# Patient Record
Sex: Female | Born: 1965
Health system: Southern US, Community
[De-identification: ages and names within clinical notes are randomized; demographics above are authoritative.]

## PROBLEM LIST (undated history)

## (undated) DIAGNOSIS — I213 ST elevation (STEMI) myocardial infarction of unspecified site: Secondary | ICD-10-CM

## (undated) DIAGNOSIS — I2510 Atherosclerotic heart disease of native coronary artery without angina pectoris: Secondary | ICD-10-CM

## (undated) DIAGNOSIS — I10 Essential (primary) hypertension: Secondary | ICD-10-CM

## (undated) DIAGNOSIS — Z9289 Personal history of other medical treatment: Secondary | ICD-10-CM

## (undated) DIAGNOSIS — I469 Cardiac arrest, cause unspecified: Secondary | ICD-10-CM

## (undated) DIAGNOSIS — E785 Hyperlipidemia, unspecified: Secondary | ICD-10-CM

## (undated) DIAGNOSIS — B009 Herpesviral infection, unspecified: Secondary | ICD-10-CM

## (undated) DIAGNOSIS — I4901 Ventricular fibrillation: Secondary | ICD-10-CM

## (undated) HISTORY — DX: Atherosclerotic heart disease of native coronary artery without angina pectoris: I25.10

## (undated) HISTORY — DX: ST elevation (STEMI) myocardial infarction of unspecified site: I21.3

## (undated) HISTORY — DX: Herpesviral infection, unspecified: B00.9

## (undated) HISTORY — DX: Ventricular fibrillation: I49.01

## (undated) HISTORY — DX: Personal history of other medical treatment: Z92.89

## (undated) HISTORY — DX: Essential (primary) hypertension: I10

## (undated) HISTORY — DX: Hyperlipidemia, unspecified: E78.5

---

## 1999-10-06 ENCOUNTER — Inpatient Hospital Stay (HOSPITAL_COMMUNITY): Admission: AD | Admit: 1999-10-06 | Discharge: 1999-10-06 | Payer: Self-pay | Admitting: Obstetrics and Gynecology

## 2004-02-12 ENCOUNTER — Encounter: Admission: RE | Admit: 2004-02-12 | Discharge: 2004-02-12 | Payer: Self-pay | Admitting: Cardiovascular Disease

## 2004-02-20 ENCOUNTER — Ambulatory Visit (HOSPITAL_COMMUNITY): Admission: RE | Admit: 2004-02-20 | Discharge: 2004-02-20 | Payer: Self-pay | Admitting: Cardiovascular Disease

## 2005-09-09 HISTORY — PX: ABDOMINAL HYSTERECTOMY: SHX81

## 2017-11-16 ENCOUNTER — Encounter: Payer: Self-pay | Admitting: *Deleted

## 2017-12-13 ENCOUNTER — Ambulatory Visit (HOSPITAL_COMMUNITY): Payer: Self-pay | Admitting: Internal Medicine

## 2017-12-13 ENCOUNTER — Encounter: Payer: Self-pay | Admitting: Adult Health

## 2017-12-18 ENCOUNTER — Encounter (HOSPITAL_COMMUNITY): Payer: Self-pay | Admitting: Hematology

## 2017-12-18 ENCOUNTER — Encounter: Payer: Self-pay | Admitting: Adult Health

## 2017-12-18 ENCOUNTER — Other Ambulatory Visit: Payer: Self-pay

## 2017-12-18 ENCOUNTER — Inpatient Hospital Stay (HOSPITAL_COMMUNITY): Payer: 59

## 2017-12-18 ENCOUNTER — Inpatient Hospital Stay (HOSPITAL_COMMUNITY): Payer: 59 | Attending: Hematology | Admitting: Hematology

## 2017-12-18 ENCOUNTER — Ambulatory Visit (INDEPENDENT_AMBULATORY_CARE_PROVIDER_SITE_OTHER): Payer: 59 | Admitting: Adult Health

## 2017-12-18 VITALS — BP 112/66 | HR 88 | Resp 16 | Ht 62.5 in | Wt 242.0 lb

## 2017-12-18 VITALS — BP 115/64 | HR 79 | Temp 98.9°F | Wt 242.6 lb

## 2017-12-18 DIAGNOSIS — R7989 Other specified abnormal findings of blood chemistry: Secondary | ICD-10-CM

## 2017-12-18 DIAGNOSIS — M25551 Pain in right hip: Secondary | ICD-10-CM

## 2017-12-18 DIAGNOSIS — Z0000 Encounter for general adult medical examination without abnormal findings: Secondary | ICD-10-CM | POA: Diagnosis not present

## 2017-12-18 DIAGNOSIS — N939 Abnormal uterine and vaginal bleeding, unspecified: Secondary | ICD-10-CM | POA: Diagnosis not present

## 2017-12-18 LAB — COMPREHENSIVE METABOLIC PANEL
ALT: 13 U/L — ABNORMAL LOW (ref 14–54)
ANION GAP: 8 (ref 5–15)
AST: 16 U/L (ref 15–41)
Albumin: 3.8 g/dL (ref 3.5–5.0)
Alkaline Phosphatase: 92 U/L (ref 38–126)
BILIRUBIN TOTAL: 0.5 mg/dL (ref 0.3–1.2)
BUN: 9 mg/dL (ref 6–20)
CO2: 25 mmol/L (ref 22–32)
Calcium: 8.7 mg/dL — ABNORMAL LOW (ref 8.9–10.3)
Chloride: 106 mmol/L (ref 101–111)
Creatinine, Ser: 0.87 mg/dL (ref 0.44–1.00)
Glucose, Bld: 107 mg/dL — ABNORMAL HIGH (ref 65–99)
POTASSIUM: 3.8 mmol/L (ref 3.5–5.1)
Sodium: 139 mmol/L (ref 135–145)
TOTAL PROTEIN: 7.1 g/dL (ref 6.5–8.1)

## 2017-12-18 LAB — SEDIMENTATION RATE: Sed Rate: 12 mm/hr (ref 0–22)

## 2017-12-18 LAB — IRON AND TIBC
Iron: 52 ug/dL (ref 28–170)
SATURATION RATIOS: 20 % (ref 10.4–31.8)
TIBC: 260 ug/dL (ref 250–450)
UIBC: 208 ug/dL

## 2017-12-18 LAB — C-REACTIVE PROTEIN: CRP: <0.8 mg/dL (ref <1.0)

## 2017-12-18 LAB — FERRITIN: FERRITIN: 227 ng/mL (ref 11–307)

## 2017-12-18 MED ORDER — METRONIDAZOLE 0.75 % VA GEL: 1.0000 | Freq: Every day | VAGINAL | 0 refills | Status: DC

## 2017-12-18 NOTE — Progress Notes (Signed)
Subjective:     Patient ID: Erin Cantu, female   DOB: 02-20-1966, 52 y.o.   MRN: [id number]  HPI [name] is a 52 year old black female, sp hysterectomy in 2006 for fibroids referred by Dr Sherryll BurgerShah for vaginal bleeding, she says the bleeding has been going on for about 5 years now.Dr Sherryll BurgerShah used silver nitrate to granulation tissue of vaginal cuff in 2016, but continued to spot, and just sees when she wipes.  She works at home for [employer]. PCP is Dr Sherryll BurgerShah.  Review of Systems Vaginal bleeding for about 5 years, sp hysterectomy No sex since December Right hip hurts when stands for long time, had injection for bursitis years ago.  Reviewed past medical,surgical, social and family history. Reviewed medications and allergies.     Objective:   Physical Exam BP 112/66 (BP Location: Left Arm, Patient Position: Sitting, Cuff Size: Large)   Pulse 88   Resp 16   Ht 5' 2.5" (1.588 m)   Wt 242 lb (109.8 kg)   LMP  (Exact Date)   BMI 43.56 kg/m   Skin warm and dry.Pelvic: external genitalia is normal in appearance no lesions, vagina: pink with good moisture, no discharge or bleeding noted,Vaginal cuff is tender when touched with procto swab, but no bleeding, urethra has no lesions or masses noted, cervix and uterus are absent, adnexa: no masses or tenderness noted. Bladder is non tender and no masses felt. PHQ 2 score 0.  Will prescribe  Metrogel for 5 nights and get GYN US to assess.     Assessment:     Vaginal bleeding after hysterectomy for fibroids in 2006 Vaginal cuff tenderness     Plan:     Rx Metrogel 1 applicator full at hs for 5 nights Return in 1 week for GYN US, we will talk when results in If has bleeding call and I will try to see that day

## 2017-12-18 NOTE — Progress Notes (Signed)
CONSULT NOTE  Patient Care Team: Medicine, [facility] as PCP - General (Internal Medicine)  CHIEF COMPLAINTS/PURPOSE OF CONSULTATION:  Elevated ferritin levels.  HISTORY OF PRESENTING ILLNESS:  Erin Cantu 52 y.o. female is here because of elevated ferritin levels noted on her blood work at Dr. Trena Platt office.  She reports that she has elevated ferritin for the last couple of years but did not follow-up on it.  She does not have any history of blood transfusions.  No personal history or family history of hemochromatosis.  Denies any liver problems.  Denies any personal or family history of thalassemia.  No connective tissue disorders noted.  She denies any fevers, night sweats or weight loss in the last 6 months.  She had about 30-40 pound weight gain in the last 2 years.  She also complains of vague right loin pain, made worse when she stands up for long time.  She is not on any prescription medications.  She takes MiraLAX only as needed for constipation.   MEDICAL HISTORY:  History reviewed. No pertinent past medical history.  SURGICAL HISTORY: Past Surgical History:  Procedure Laterality Date  . ABDOMINAL HYSTERECTOMY  09/2005   done for fibroids    SOCIAL HISTORY: Social History   Socioeconomic History  . Marital status: Divorced    Spouse name: Not on file  . Number of children: Not on file  . Years of education: Not on file  . Highest education level: Not on file  Social Needs  . Financial resource strain: Not on file  . Food insecurity - worry: Not on file  . Food insecurity - inability: Not on file  . Transportation needs - medical: Not on file  . Transportation needs - non-medical: Not on file  Occupational History  . Not on file  Tobacco Use  . Smoking status: Never Smoker  . Smokeless tobacco: Never Used  Substance and Sexual Activity  . Alcohol use: No    Frequency: Never  . Drug use: No  . Sexual activity: Not Currently    Birth control/protection:  Post-menopausal, Surgical    Comment: hysterectomy  Other Topics Concern  . Not on file  Social History Narrative  . Not on file    FAMILY HISTORY: Family History  Problem Relation Age of Onset  . Cancer Paternal Grandmother        breast  . Diabetes Maternal Grandmother   . COPD Father   . Stroke Mother   . Colon cancer Mother   . Cancer Brother        metastatic lung cancer  . Proteinuria Daughter     ALLERGIES:  has No Known Allergies.  MEDICATIONS:  No current outpatient medications on file.   No current facility-administered medications for this visit.     REVIEW OF SYSTEMS:   Constitutional: Denies fevers, chills or abnormal night sweats Eyes: Denies blurriness of vision, double vision or watery eyes Ears, nose, mouth, throat, and face: Denies mucositis or sore throat Respiratory: Denies cough, dyspnea or wheezes Cardiovascular: Denies palpitation, chest discomfort or lower extremity swelling Gastrointestinal:  Denies nausea, heartburn or change in bowel habits Skin: Denies abnormal skin rashes Lymphatics: Denies new lymphadenopathy or easy bruising Neurological:Denies numbness, tingling or new weaknesses Behavioral/Psych: Mood is stable, no new changes  All other systems were reviewed with the patient and are negative.  PHYSICAL EXAMINATION: ECOG PERFORMANCE STATUS: 0 - Asymptomatic  Vitals:   12/18/17 0909  BP: 115/64  Pulse: 79  Temp: 98.9  F (37.2 C)  SpO2: 100%   Filed Weights   12/18/17 0909  Weight: 242 lb 9.6 oz (110 kg)    GENERAL:alert, no distress and comfortable SKIN: skin color, texture, turgor are normal, no rashes or significant lesions EYES: normal, conjunctiva are pink and non-injected, sclera clear OROPHARYNX:no exudate, no erythema and lips, buccal mucosa, and tongue normal  NECK: supple, thyroid normal size, non-tender, without nodularity LYMPH:  no palpable lymphadenopathy in the cervical, axillary or inguinal LUNGS: clear to  auscultation and percussion with normal breathing effort HEART: regular rate & rhythm and no murmurs and no lower extremity edema ABDOMEN:abdomen soft, non-tender and normal bowel sounds Musculoskeletal:no cyanosis of digits and no clubbing  PSYCH: alert & oriented x 3 with fluent speech NEURO: no focal motor/sensory deficits  LABORATORY DATA:  I have reviewed the data as listed No results found for this or any previous visit (from the past 2160 hour(s)).  RADIOGRAPHIC STUDIES: I have personally reviewed the radiological images as listed and agreed with the findings in the report. No results found.  ASSESSMENT & PLAN:  1. Elevated ferritin levels: Her ferritin was elevated at 464.  However her saturation was 28% which was within normal limits.  She had a normal hemoglobin with a normal MCV.  Differential diagnosis of iron overload includes inflammatory conditions, liver disease, heterozygous hemochromatosis.  We will repeat ferritin, iron panel, and LFTs today.  I would also send a hepatitis panel,ESR, And CRP .  We will also send hemochromatosis panel.  She will be seen back in 3-4 weeks for follow-up.  2. Healthcare maintenance: She did not have mammograms in the last 2 years.  I have ordered her screening mammogram today.    All questions were answered. The patient knows to call the clinic with any problems, questions or concerns.      Derek Jack, MD 12/18/17 10:47 AM

## 2017-12-19 LAB — HEPATITIS PANEL, ACUTE
HCV Ab: <0.1 s/co ratio (ref 0.0–0.9)
HEP A IGM: NEGATIVE
HEP B C IGM: NEGATIVE
Hepatitis B Surface Ag: NEGATIVE

## 2017-12-20 ENCOUNTER — Ambulatory Visit (HOSPITAL_COMMUNITY): Payer: 59

## 2017-12-22 LAB — HEMOCHROMATOSIS DNA-PCR(C282Y,H63D)

## 2017-12-28 ENCOUNTER — Other Ambulatory Visit: Payer: Self-pay

## 2018-01-18 ENCOUNTER — Ambulatory Visit (HOSPITAL_COMMUNITY): Payer: 59 | Admitting: Hematology

## 2018-01-23 ENCOUNTER — Telehealth: Payer: Self-pay | Admitting: Adult Health

## 2018-01-23 MED ORDER — NITROFURANTOIN MONOHYD MACRO 100 MG PO CAPS: 100.0000 mg | ORAL_CAPSULE | Freq: Two times a day (BID) | ORAL | 0 refills | Status: DC

## 2018-01-23 NOTE — Telephone Encounter (Signed)
Pt using WIFI number and it keeps cutting off, has burning when pees, so called back and left message that she needs appt.

## 2018-01-23 NOTE — Telephone Encounter (Signed)
Pt called back, can't come in for appt, will rx macrobid, and try to get US rescheduled.

## 2018-01-23 NOTE — Telephone Encounter (Signed)
Patient called stating that she has a bad UTI and she would like for Victorino DikeJennifer to call her in something to [facility] [place]. Please contact pt

## 2018-02-01 ENCOUNTER — Other Ambulatory Visit: Payer: Self-pay | Admitting: Family Medicine

## 2018-02-01 ENCOUNTER — Other Ambulatory Visit (HOSPITAL_COMMUNITY)
Admission: RE | Admit: 2018-02-01 | Discharge: 2018-02-01 | Disposition: A | Discharge status: Home / Self Care | Payer: 59 | Source: Ambulatory Visit | Attending: Family Medicine | Admitting: Family Medicine

## 2018-02-01 DIAGNOSIS — Z01419 Encounter for gynecological examination (general) (routine) without abnormal findings: Secondary | ICD-10-CM | POA: Diagnosis not present

## 2018-02-05 LAB — CYTOLOGY - PAP: Diagnosis: NEGATIVE

## 2018-02-26 ENCOUNTER — Ambulatory Visit (HOSPITAL_COMMUNITY): Payer: 59 | Admitting: Hematology

## 2018-07-04 ENCOUNTER — Emergency Department (HOSPITAL_COMMUNITY): Payer: 59

## 2018-07-04 ENCOUNTER — Other Ambulatory Visit: Payer: Self-pay

## 2018-07-04 ENCOUNTER — Emergency Department (HOSPITAL_COMMUNITY)
Admission: EM | Admit: 2018-07-04 | Discharge: 2018-07-04 | Disposition: A | Discharge status: Home / Self Care | Payer: 59 | Attending: Emergency Medicine | Admitting: Emergency Medicine

## 2018-07-04 ENCOUNTER — Encounter (HOSPITAL_COMMUNITY): Payer: Self-pay

## 2018-07-04 DIAGNOSIS — R0789 Other chest pain: Secondary | ICD-10-CM | POA: Diagnosis not present

## 2018-07-04 DIAGNOSIS — Z79899 Other long term (current) drug therapy: Secondary | ICD-10-CM | POA: Insufficient documentation

## 2018-07-04 DIAGNOSIS — R079 Chest pain, unspecified: Secondary | ICD-10-CM | POA: Diagnosis present

## 2018-07-04 LAB — CBC
HCT: 39.3 % (ref 36.0–46.0)
Hemoglobin: 12.5 g/dL (ref 12.0–15.0)
MCH: 28.9 pg (ref 26.0–34.0)
MCHC: 31.8 g/dL (ref 30.0–36.0)
MCV: 91 fL (ref 78.0–100.0)
PLATELETS: 200 10*3/uL (ref 150–400)
RBC: 4.32 MIL/uL (ref 3.87–5.11)
RDW: 13 % (ref 11.5–15.5)
WBC: 5 10*3/uL (ref 4.0–10.5)

## 2018-07-04 LAB — BASIC METABOLIC PANEL
ANION GAP: 8 (ref 5–15)
BUN: 9 mg/dL (ref 6–20)
CALCIUM: 8.9 mg/dL (ref 8.9–10.3)
CHLORIDE: 106 mmol/L (ref 98–111)
CO2: 26 mmol/L (ref 22–32)
CREATININE: 0.82 mg/dL (ref 0.44–1.00)
GFR calc Af Amer: >60 mL/min (ref >60)
GFR calc non Af Amer: >60 mL/min (ref >60)
GLUCOSE: 99 mg/dL (ref 70–99)
Potassium: 3.3 mmol/L — ABNORMAL LOW (ref 3.5–5.1)
Sodium: 140 mmol/L (ref 135–145)

## 2018-07-04 LAB — TROPONIN I
Troponin I: <0.03 ng/mL (ref <0.03)
Troponin I: <0.03 ng/mL (ref <0.03)

## 2018-07-04 LAB — D-DIMER, QUANTITATIVE: D-Dimer, Quant: 0.44 ug/mL-FEU (ref 0.00–0.50)

## 2018-07-04 MED ORDER — KETOROLAC TROMETHAMINE 30 MG/ML IJ SOLN
30.0000 mg | Freq: Once | INTRAMUSCULAR | Status: AC
Start: 2018-07-04 — End: 2018-07-04
  Administered 2018-07-04: 30 mg via INTRAVENOUS
  Filled 2018-07-04: qty 1

## 2018-07-04 MED ORDER — TRAMADOL HCL 50 MG PO TABS: 50.0000 mg | ORAL_TABLET | Freq: Four times a day (QID) | ORAL | 0 refills | Status: DC | PRN

## 2018-07-04 NOTE — Discharge Instructions (Signed)
Chest pain most likely chest wall in nature.  Take the tramadol as directed.  Make an appointment follow-up with your regular doctor.  Return for any new or worse symptoms.  Today's work-up to include cardiac marker troponin x2 were negative.  Also d-dimer screening test for blood clots in the lungs was also negative.  Chest x-ray without any acute findings.  EKG without any acute abnormalities.

## 2018-07-04 NOTE — ED Triage Notes (Signed)
Pt says has been under a lot of stress recently and was crying yesterday and upset and started having chest pain.  Pt says pain is worse when she bends over.  Denies sob, n/v.  Pt says pain is nonradiating and is in the center of her chest.

## 2018-07-04 NOTE — ED Provider Notes (Signed)
[facility] Provider Note   CSN: [id number] Arrival date & time: 07/04/18  1413     History   Chief Complaint Chief Complaint  Patient presents with  . Chest Pain    HPI Erin Cantu is a 52 y.o. female.  Patient admits to being under a lot of stress.  Yesterday patient had substernal chest pain from 3 PM to 4 PM then went away.  Today at about 11 AM in the morning it started again and has been persistent nonstop it has waxed and waned some but never gone away.  Not associated with shortness of breath not associated with nausea or vomiting.  Made worse by leaning over and by certain movements.  Patient has no lower extremity swelling no known history of coronary artery disease.  No known risk factors.  No family history of premature heart disease.  Patient is never smoked.  Past medical history noncontributory past surgical history abdominal hysterectomy.  Pain is been nonradiating.     History reviewed. No pertinent past medical history.  There are no active problems to display for this patient.   Past Surgical History:  Procedure Laterality Date  . ABDOMINAL HYSTERECTOMY  09/2005   done for fibroids     OB History    Gravida  2   Para  1   Term  1   Preterm      AB  1   Living  1     SAB  1   TAB      Ectopic      Multiple      Live Births               Home Medications    Prior to Admission medications   Medication Sig Start Date End Date Taking? Authorizing Provider  Cholecalciferol (VITAMIN D3) 5000 units TABS Take 5,000 tablets by mouth daily.   Yes [provider]  ibuprofen (ADVIL,MOTRIN) 200 MG tablet Take 400 mg by mouth every 6 (six) hours as needed for moderate pain.   Yes [provider]  traMADol (ULTRAM) 50 MG tablet Take 1 tablet (50 mg total) by mouth every 6 (six) hours as needed. 07/04/18   Vanetta MuldersZackowski, Natalee Tomkiewicz, MD    Family History Family History  Problem Relation Age of Onset  . Cancer  Paternal Grandmother        breast  . Diabetes Maternal Grandmother   . COPD Father   . Stroke Mother   . Colon cancer Mother   . Cancer Brother        metastatic lung cancer  . Proteinuria Daughter     Social History Social History   Tobacco Use  . Smoking status: Never Smoker  . Smokeless tobacco: Never Used  Substance Use Topics  . Alcohol use: No    Frequency: Never  . Drug use: No     Allergies   Patient has no known allergies.   Review of Systems Review of Systems  Constitutional: Negative for diaphoresis and fever.  HENT: Negative for congestion.   Eyes: Negative for visual disturbance.  Respiratory: Negative for shortness of breath.   Cardiovascular: Positive for chest pain. Negative for leg swelling.  Gastrointestinal: Negative for abdominal pain, nausea and vomiting.  Genitourinary: Negative for dysuria.  Musculoskeletal: Negative for back pain.  Skin: Negative for rash.  Neurological: Negative for syncope.  Hematological: Does not bruise/bleed easily.  Psychiatric/Behavioral: Negative for confusion.     Physical Exam Updated Vital  Signs BP 136/78   Pulse (!) 56   Temp 98.5 F (36.9 C) (Oral)   Resp 20   Ht 1.575 m (5\' 2" )   Wt 102.1 kg   SpO2 99%   BMI 41.15 kg/m   Physical Exam  Constitutional: She is oriented to person, place, and time. She appears well-developed and well-nourished. No distress.  HENT:  Head: Normocephalic and atraumatic.  Mouth/Throat: Oropharynx is clear and moist.  Eyes: Pupils are equal, round, and reactive to light. Conjunctivae and EOM are normal.  Neck: Neck supple.  Cardiovascular: Normal rate and regular rhythm.  Pulmonary/Chest: Effort normal. No respiratory distress. She exhibits tenderness.  Abdominal: Soft. Bowel sounds are normal. There is no tenderness.  Musculoskeletal: She exhibits no edema.  Neurological: She is alert and oriented to person, place, and time. No cranial nerve deficit or sensory  deficit. She exhibits normal muscle tone. Coordination normal.  Skin: Skin is warm. No rash noted.  Nursing note and vitals reviewed.    ED Treatments / Results  Labs (all labs ordered are listed, but only abnormal results are displayed) Labs Reviewed  BASIC METABOLIC PANEL - Abnormal; Notable for the following components:      Result Value   Potassium 3.3 (*)    All other components within normal limits  CBC  TROPONIN I  TROPONIN I  D-DIMER, QUANTITATIVE (NOT AT [facility])    EKG EKG Interpretation  Date/Time:  Wednesday July 04 2018 14:20:40 EDT Ventricular Rate:  75 PR Interval:  174 QRS Duration: 76 QT Interval:  370 QTC Calculation: 413 R Axis:   69 Text Interpretation:  Normal sinus rhythm Low voltage QRS Borderline ECG No previous ECGs available Confirmed by Vanetta Mulders [phone]) on 07/04/2018 4:51:06 PM   Radiology Dg Chest 2 View  Result Date: 07/04/2018 CLINICAL DATA:  Acute onset of mid chest pain which the patient states may be anxiety induced. The chest pain began yesterday. EXAM: CHEST - 2 VIEW COMPARISON:  None. FINDINGS: Cardiomediastinal silhouette unremarkable. Small nodule overlying the RIGHT lung base on the PA image, not localized on the LATERAL image, dense for its small size. Lungs otherwise clear. Bronchovascular markings normal. Pulmonary vascularity normal. No pleural effusions. Minimal degenerative changes involving the thoracic spine and slight thoracic dextroscoliosis. IMPRESSION: 1. Small nodule overlying the RIGHT lung base on the PA image, not localized on the LATERAL image. Given its density for its small size, this likely represents a benign calcified granuloma. A non-contrast CT chest may be confirmatory. 2.  No acute cardiopulmonary disease. Electronically Signed   By: Erin Saas M.D.   On: 07/04/2018 14:50    Procedures Procedures (including critical care time)  Medications Ordered in ED Medications  ketorolac (TORADOL) 30 MG/ML  injection 30 mg (30 mg Intravenous Given 07/04/18 1737)     Initial Impression / Assessment and Plan / ED Course  I have reviewed the triage vital signs and the nursing notes.  Pertinent labs & imaging results that were available during my care of the patient were reviewed by me and considered in my medical decision making (see chart for details).    Work-up here in the emergency department chest x-ray negative EKG without acute findings.  Troponins x2- d-dimer negative.  Patient with reproducible chest pain with palpation of the sternal area.  That is where the pain has been.  Helped with Toradol here.  Patient has primary care doctor to follow-up with his followed by [facility] physicians.  Patient will return for  any new or worse symptoms.  Clinically feel as this is chest wall pain and not an acute cardiac event.   Final Clinical Impressions(s) / ED Diagnoses   Final diagnoses:  Chest wall pain    ED Discharge Orders         Ordered    traMADol (ULTRAM) 50 MG tablet  Every 6 hours PRN     07/04/18 1900           Vanetta Mulders, MD 07/04/18 1908

## 2018-07-10 DIAGNOSIS — I213 ST elevation (STEMI) myocardial infarction of unspecified site: Secondary | ICD-10-CM

## 2018-07-10 HISTORY — DX: ST elevation (STEMI) myocardial infarction of unspecified site: I21.3

## 2018-07-11 ENCOUNTER — Other Ambulatory Visit: Payer: Self-pay

## 2018-07-11 ENCOUNTER — Emergency Department (HOSPITAL_COMMUNITY): Payer: 59

## 2018-07-11 ENCOUNTER — Encounter (HOSPITAL_COMMUNITY)
Admission: EM | Disposition: A | Discharge status: Home / Self Care | Payer: Self-pay | Source: Home / Self Care | Attending: Cardiovascular Disease

## 2018-07-11 ENCOUNTER — Inpatient Hospital Stay (HOSPITAL_COMMUNITY)
Admission: EM | Admit: 2018-07-11 | Discharge: 2018-07-13 | DRG: 250 | Disposition: A | Discharge status: Home / Self Care | Payer: 59 | Attending: Interventional Cardiology | Admitting: Interventional Cardiology

## 2018-07-11 DIAGNOSIS — I462 Cardiac arrest due to underlying cardiac condition: Secondary | ICD-10-CM | POA: Diagnosis present

## 2018-07-11 DIAGNOSIS — I2121 ST elevation (STEMI) myocardial infarction involving left circumflex coronary artery: Secondary | ICD-10-CM | POA: Diagnosis not present

## 2018-07-11 DIAGNOSIS — Z800 Family history of malignant neoplasm of digestive organs: Secondary | ICD-10-CM

## 2018-07-11 DIAGNOSIS — Z79899 Other long term (current) drug therapy: Secondary | ICD-10-CM | POA: Diagnosis not present

## 2018-07-11 DIAGNOSIS — I2119 ST elevation (STEMI) myocardial infarction involving other coronary artery of inferior wall: Secondary | ICD-10-CM | POA: Diagnosis present

## 2018-07-11 DIAGNOSIS — Z9861 Coronary angioplasty status: Secondary | ICD-10-CM

## 2018-07-11 DIAGNOSIS — Z825 Family history of asthma and other chronic lower respiratory diseases: Secondary | ICD-10-CM

## 2018-07-11 DIAGNOSIS — A599 Trichomoniasis, unspecified: Secondary | ICD-10-CM | POA: Diagnosis present

## 2018-07-11 DIAGNOSIS — Z823 Family history of stroke: Secondary | ICD-10-CM | POA: Diagnosis not present

## 2018-07-11 DIAGNOSIS — I10 Essential (primary) hypertension: Secondary | ICD-10-CM

## 2018-07-11 DIAGNOSIS — Z79891 Long term (current) use of opiate analgesic: Secondary | ICD-10-CM

## 2018-07-11 DIAGNOSIS — I2510 Atherosclerotic heart disease of native coronary artery without angina pectoris: Secondary | ICD-10-CM | POA: Diagnosis not present

## 2018-07-11 DIAGNOSIS — Z833 Family history of diabetes mellitus: Secondary | ICD-10-CM

## 2018-07-11 DIAGNOSIS — I4901 Ventricular fibrillation: Secondary | ICD-10-CM | POA: Diagnosis present

## 2018-07-11 DIAGNOSIS — Z791 Long term (current) use of non-steroidal anti-inflammatories (NSAID): Secondary | ICD-10-CM

## 2018-07-11 DIAGNOSIS — Z90710 Acquired absence of both cervix and uterus: Secondary | ICD-10-CM

## 2018-07-11 DIAGNOSIS — I469 Cardiac arrest, cause unspecified: Secondary | ICD-10-CM

## 2018-07-11 DIAGNOSIS — I493 Ventricular premature depolarization: Secondary | ICD-10-CM | POA: Diagnosis present

## 2018-07-11 DIAGNOSIS — E785 Hyperlipidemia, unspecified: Secondary | ICD-10-CM | POA: Diagnosis present

## 2018-07-11 DIAGNOSIS — E782 Mixed hyperlipidemia: Secondary | ICD-10-CM | POA: Diagnosis not present

## 2018-07-11 DIAGNOSIS — I213 ST elevation (STEMI) myocardial infarction of unspecified site: Secondary | ICD-10-CM

## 2018-07-11 DIAGNOSIS — I252 Old myocardial infarction: Secondary | ICD-10-CM | POA: Diagnosis present

## 2018-07-11 HISTORY — PX: LEFT HEART CATH AND CORONARY ANGIOGRAPHY: CATH118249

## 2018-07-11 HISTORY — PX: CORONARY BALLOON ANGIOPLASTY: CATH118233

## 2018-07-11 HISTORY — DX: Cardiac arrest, cause unspecified: I46.9

## 2018-07-11 LAB — CBC WITH DIFFERENTIAL/PLATELET
BASOS PCT: 1 %
Basophils Absolute: 0 10*3/uL (ref 0.0–0.1)
EOS ABS: 0.1 10*3/uL (ref 0.0–0.7)
EOS PCT: 3 %
HCT: 42.1 % (ref 36.0–46.0)
Hemoglobin: 13.5 g/dL (ref 12.0–15.0)
LYMPHS ABS: 3.2 10*3/uL (ref 0.7–4.0)
Lymphocytes Relative: 68 %
MCH: 29.2 pg (ref 26.0–34.0)
MCHC: 32.1 g/dL (ref 30.0–36.0)
MCV: 91.1 fL (ref 78.0–100.0)
Monocytes Absolute: 0.2 10*3/uL (ref 0.1–1.0)
Monocytes Relative: 5 %
Neutro Abs: 1.2 10*3/uL — ABNORMAL LOW (ref 1.7–7.7)
Neutrophils Relative %: 25 %
PLATELETS: 231 10*3/uL (ref 150–400)
RBC: 4.62 MIL/uL (ref 3.87–5.11)
RDW: 13.3 % (ref 11.5–15.5)
WBC: 4.8 10*3/uL (ref 4.0–10.5)

## 2018-07-11 LAB — COMPREHENSIVE METABOLIC PANEL
ALK PHOS: 88 U/L (ref 38–126)
ALT: 104 U/L — ABNORMAL HIGH (ref 0–44)
ANION GAP: 11 (ref 5–15)
AST: 106 U/L — ABNORMAL HIGH (ref 15–41)
Albumin: 3.8 g/dL (ref 3.5–5.0)
BILIRUBIN TOTAL: 0.5 mg/dL (ref 0.3–1.2)
BUN: 9 mg/dL (ref 6–20)
CALCIUM: 8.6 mg/dL — AB (ref 8.9–10.3)
CO2: 21 mmol/L — ABNORMAL LOW (ref 22–32)
Chloride: 105 mmol/L (ref 98–111)
Creatinine, Ser: 0.96 mg/dL (ref 0.44–1.00)
GFR calc non Af Amer: >60 mL/min (ref >60)
Glucose, Bld: 183 mg/dL — ABNORMAL HIGH (ref 70–99)
Potassium: 3.2 mmol/L — ABNORMAL LOW (ref 3.5–5.1)
Sodium: 137 mmol/L (ref 135–145)
TOTAL PROTEIN: 7 g/dL (ref 6.5–8.1)

## 2018-07-11 LAB — CBC
HCT: 42.4 % (ref 36.0–46.0)
HEMOGLOBIN: 13.3 g/dL (ref 12.0–15.0)
MCH: 28.9 pg (ref 26.0–34.0)
MCHC: 31.4 g/dL (ref 30.0–36.0)
MCV: 92 fL (ref 78.0–100.0)
PLATELETS: 256 10*3/uL (ref 150–400)
RBC: 4.61 MIL/uL (ref 3.87–5.11)
RDW: 13.2 % (ref 11.5–15.5)
WBC: 11.9 10*3/uL — ABNORMAL HIGH (ref 4.0–10.5)

## 2018-07-11 LAB — PROTIME-INR
INR: 1.01
PROTHROMBIN TIME: 13.2 s (ref 11.4–15.2)

## 2018-07-11 LAB — TROPONIN I: Troponin I: 13.39 ng/mL (ref <0.03)

## 2018-07-11 LAB — CREATININE, SERUM
CREATININE: 0.98 mg/dL (ref 0.44–1.00)
GFR calc Af Amer: >60 mL/min (ref >60)

## 2018-07-11 LAB — LIPID PANEL
CHOLESTEROL: 206 mg/dL — AB (ref 0–200)
HDL: 60 mg/dL (ref >40)
LDL Cholesterol: 132 mg/dL — ABNORMAL HIGH (ref 0–99)
TRIGLYCERIDES: 71 mg/dL (ref <150)
Total CHOL/HDL Ratio: 3.4 RATIO
VLDL: 14 mg/dL (ref 0–40)

## 2018-07-11 LAB — I-STAT BETA HCG BLOOD, ED (MC, WL, AP ONLY): I-stat hCG, quantitative: <5 m[IU]/mL (ref <5)

## 2018-07-11 LAB — APTT: aPTT: 21 seconds — ABNORMAL LOW (ref 24–36)

## 2018-07-11 LAB — I-STAT TROPONIN, ED: TROPONIN I, POC: 0.03 ng/mL (ref 0.00–0.08)

## 2018-07-11 LAB — MRSA PCR SCREENING: MRSA BY PCR: NEGATIVE

## 2018-07-11 LAB — POCT ACTIVATED CLOTTING TIME: ACTIVATED CLOTTING TIME: 230 s

## 2018-07-11 SURGERY — LEFT HEART CATH AND CORONARY ANGIOGRAPHY
Anesthesia: LOCAL

## 2018-07-11 MED ORDER — HEPARIN SODIUM (PORCINE) 5000 UNIT/ML IJ SOLN
5000.0000 [IU] | Freq: Once | INTRAMUSCULAR | Status: AC
  Administered 2018-07-11: 5000 [IU] via INTRAVENOUS

## 2018-07-11 MED ORDER — MIDAZOLAM HCL 2 MG/2ML IJ SOLN
INTRAMUSCULAR | Status: DC | PRN
  Administered 2018-07-11 (×3): 1 mg via INTRAVENOUS

## 2018-07-11 MED ORDER — ASPIRIN 81 MG PO CHEW
CHEWABLE_TABLET | ORAL | Status: AC
  Filled 2018-07-11: qty 1

## 2018-07-11 MED ORDER — MORPHINE SULFATE (PF) 2 MG/ML IV SOLN
2.0000 mg | Freq: Once | INTRAVENOUS | Status: AC
  Administered 2018-07-11: 2 mg via INTRAVENOUS

## 2018-07-11 MED ORDER — HEPARIN (PORCINE) IN NACL 100-0.45 UNIT/ML-% IJ SOLN
INTRAMUSCULAR | Status: AC
  Filled 2018-07-11: qty 250

## 2018-07-11 MED ORDER — ONDANSETRON HCL 4 MG/2ML IJ SOLN
INTRAMUSCULAR | Status: DC | PRN
  Administered 2018-07-11: 4 mg via INTRAVENOUS

## 2018-07-11 MED ORDER — MORPHINE SULFATE (PF) 2 MG/ML IV SOLN
INTRAVENOUS | Status: AC
  Filled 2018-07-11: qty 1

## 2018-07-11 MED ORDER — ASPIRIN 81 MG PO CHEW
324.0000 mg | CHEWABLE_TABLET | Freq: Once | ORAL | Status: AC
  Administered 2018-07-11: 324 mg via ORAL

## 2018-07-11 MED ORDER — HEPARIN (PORCINE) IN NACL 1000-0.9 UT/500ML-% IV SOLN
INTRAVENOUS | Status: DC | PRN
  Administered 2018-07-11 (×2): 500 mL

## 2018-07-11 MED ORDER — ASPIRIN 300 MG RE SUPP
RECTAL | Status: AC
  Filled 2018-07-11: qty 1

## 2018-07-11 MED ORDER — SODIUM CHLORIDE 0.9 % IV SOLN: INTRAVENOUS | Status: DC

## 2018-07-11 MED ORDER — METOPROLOL TARTRATE 12.5 MG HALF TABLET
12.5000 mg | ORAL_TABLET | Freq: Two times a day (BID) | ORAL | Status: DC
  Administered 2018-07-11 – 2018-07-13 (×4): 12.5 mg via ORAL
  Filled 2018-07-11 (×4): qty 1

## 2018-07-11 MED ORDER — LORAZEPAM 2 MG/ML IJ SOLN
INTRAMUSCULAR | Status: AC
  Filled 2018-07-11: qty 1

## 2018-07-11 MED ORDER — ONDANSETRON HCL 4 MG/2ML IJ SOLN
4.0000 mg | Freq: Four times a day (QID) | INTRAMUSCULAR | Status: DC | PRN
  Administered 2018-07-11: 4 mg via INTRAVENOUS
  Filled 2018-07-11: qty 2

## 2018-07-11 MED ORDER — HEPARIN (PORCINE) IN NACL 100-0.45 UNIT/ML-% IJ SOLN
12.0000 [IU]/kg/h | Freq: Once | INTRAMUSCULAR | Status: AC
  Administered 2018-07-11: 12 [IU]/kg/h via INTRAVENOUS

## 2018-07-11 MED ORDER — MIDAZOLAM HCL 2 MG/2ML IJ SOLN
INTRAMUSCULAR | Status: AC
  Filled 2018-07-11: qty 2

## 2018-07-11 MED ORDER — FENTANYL CITRATE (PF) 100 MCG/2ML IJ SOLN
INTRAMUSCULAR | Status: AC
  Filled 2018-07-11: qty 2

## 2018-07-11 MED ORDER — ONDANSETRON HCL 4 MG/2ML IJ SOLN
INTRAMUSCULAR | Status: AC
  Filled 2018-07-11: qty 2

## 2018-07-11 MED ORDER — LIDOCAINE HCL (PF) 1 % IJ SOLN
INTRAMUSCULAR | Status: DC | PRN
  Administered 2018-07-11: 2 mL

## 2018-07-11 MED ORDER — ASPIRIN EC 81 MG PO TBEC
81.0000 mg | DELAYED_RELEASE_TABLET | Freq: Every day | ORAL | Status: DC
  Administered 2018-07-12 – 2018-07-13 (×2): 81 mg via ORAL
  Filled 2018-07-11 (×2): qty 1

## 2018-07-11 MED ORDER — NITROGLYCERIN 0.4 MG SL SUBL: 0.4000 mg | SUBLINGUAL_TABLET | SUBLINGUAL | Status: DC | PRN

## 2018-07-11 MED ORDER — SODIUM CHLORIDE 0.9% FLUSH
3.0000 mL | Freq: Two times a day (BID) | INTRAVENOUS | Status: DC
  Administered 2018-07-11 – 2018-07-13 (×3): 3 mL via INTRAVENOUS

## 2018-07-11 MED ORDER — HEPARIN (PORCINE) IN NACL 1000-0.9 UT/500ML-% IV SOLN
INTRAVENOUS | Status: AC
  Filled 2018-07-11: qty 500

## 2018-07-11 MED ORDER — LIDOCAINE HCL (PF) 1 % IJ SOLN
INTRAMUSCULAR | Status: AC
  Filled 2018-07-11: qty 30

## 2018-07-11 MED ORDER — ATORVASTATIN CALCIUM 80 MG PO TABS
80.0000 mg | ORAL_TABLET | Freq: Every day | ORAL | Status: DC
  Administered 2018-07-11 – 2018-07-12 (×2): 80 mg via ORAL
  Filled 2018-07-11 (×2): qty 1

## 2018-07-11 MED ORDER — VERAPAMIL HCL 2.5 MG/ML IV SOLN
INTRAVENOUS | Status: AC
  Filled 2018-07-11: qty 2

## 2018-07-11 MED ORDER — HEPARIN (PORCINE) IN NACL 100-0.45 UNIT/ML-% IJ SOLN: 900.0000 [IU]/h | INTRAMUSCULAR | Status: DC

## 2018-07-11 MED ORDER — HEPARIN SODIUM (PORCINE) 1000 UNIT/ML IJ SOLN
INTRAMUSCULAR | Status: DC | PRN
  Administered 2018-07-11: 5000 [IU] via INTRAVENOUS

## 2018-07-11 MED ORDER — LABETALOL HCL 5 MG/ML IV SOLN: 10.0000 mg | INTRAVENOUS | Status: AC | PRN

## 2018-07-11 MED ORDER — ACETAMINOPHEN 325 MG PO TABS
650.0000 mg | ORAL_TABLET | ORAL | Status: DC | PRN
  Administered 2018-07-11: 650 mg via ORAL
  Filled 2018-07-11: qty 2

## 2018-07-11 MED ORDER — TICAGRELOR 90 MG PO TABS
ORAL_TABLET | ORAL | Status: AC
  Filled 2018-07-11: qty 2

## 2018-07-11 MED ORDER — HEPARIN SODIUM (PORCINE) 1000 UNIT/ML IJ SOLN
INTRAMUSCULAR | Status: AC
  Filled 2018-07-11: qty 1

## 2018-07-11 MED ORDER — HEPARIN SODIUM (PORCINE) 5000 UNIT/ML IJ SOLN
INTRAMUSCULAR | Status: AC
  Filled 2018-07-11: qty 1

## 2018-07-11 MED ORDER — SODIUM CHLORIDE 0.9% FLUSH: 3.0000 mL | INTRAVENOUS | Status: DC | PRN

## 2018-07-11 MED ORDER — HYDRALAZINE HCL 20 MG/ML IJ SOLN: 5.0000 mg | INTRAMUSCULAR | Status: AC | PRN

## 2018-07-11 MED ORDER — FENTANYL CITRATE (PF) 100 MCG/2ML IJ SOLN
INTRAMUSCULAR | Status: DC | PRN
  Administered 2018-07-11 (×3): 25 ug via INTRAVENOUS

## 2018-07-11 MED ORDER — TICAGRELOR 90 MG PO TABS
ORAL_TABLET | ORAL | Status: DC | PRN
  Administered 2018-07-11: 180 mg via ORAL

## 2018-07-11 MED ORDER — HEPARIN SODIUM (PORCINE) 5000 UNIT/ML IJ SOLN
5000.0000 [IU] | Freq: Three times a day (TID) | INTRAMUSCULAR | Status: DC
  Administered 2018-07-12 – 2018-07-13 (×3): 5000 [IU] via SUBCUTANEOUS
  Filled 2018-07-11 (×3): qty 1

## 2018-07-11 MED ORDER — POTASSIUM CHLORIDE 10 MEQ/100ML IV SOLN
10.0000 meq | INTRAVENOUS | Status: AC
  Administered 2018-07-11 – 2018-07-12 (×2): 10 meq via INTRAVENOUS
  Filled 2018-07-11 (×2): qty 100

## 2018-07-11 MED ORDER — POTASSIUM CHLORIDE ER 10 MEQ PO TBCR
40.0000 meq | EXTENDED_RELEASE_TABLET | Freq: Once | ORAL | Status: AC
  Administered 2018-07-11: 40 meq via ORAL
  Filled 2018-07-11 (×2): qty 4

## 2018-07-11 MED ORDER — SODIUM CHLORIDE 0.9 % IV SOLN
INTRAVENOUS | Status: AC
  Administered 2018-07-11: 18:00:00 via INTRAVENOUS

## 2018-07-11 MED ORDER — SODIUM CHLORIDE 0.9 % IV SOLN: 250.0000 mL | INTRAVENOUS | Status: DC | PRN

## 2018-07-11 MED ORDER — TICAGRELOR 90 MG PO TABS
90.0000 mg | ORAL_TABLET | Freq: Two times a day (BID) | ORAL | Status: DC
  Administered 2018-07-12 – 2018-07-13 (×3): 90 mg via ORAL
  Filled 2018-07-11 (×3): qty 1

## 2018-07-11 SURGICAL SUPPLY — 19 items
BALLN SAPPHIRE 2.0X12 (BALLOONS) ×2
BALLOON SAPPHIRE 2.0X12 (BALLOONS) IMPLANT
CATH INFINITI 5 FR JL3.5 (CATHETERS) ×1 IMPLANT
CATH INFINITI 5FR ANG PIGTAIL (CATHETERS) ×1 IMPLANT
CATH INFINITI JR4 5F (CATHETERS) ×1 IMPLANT
CATH LAUNCHER 5F EBU3.0 (CATHETERS) IMPLANT
CATH LAUNCHER 6FR EBU 3 (CATHETERS) ×1 IMPLANT
CATHETER LAUNCHER 5F EBU3.0 (CATHETERS) ×2
DEVICE RAD COMP TR BAND LRG (VASCULAR PRODUCTS) ×1 IMPLANT
GLIDESHEATH SLEND SS 6F .021 (SHEATH) ×1 IMPLANT
GUIDEWIRE INQWIRE 1.5J.035X260 (WIRE) IMPLANT
INQWIRE 1.5J .035X260CM (WIRE) ×2
KIT ENCORE 26 ADVANTAGE (KITS) ×1 IMPLANT
KIT HEART LEFT (KITS) ×2 IMPLANT
PACK CARDIAC CATHETERIZATION (CUSTOM PROCEDURE TRAY) ×2 IMPLANT
SYR MEDRAD MARK V 150ML (SYRINGE) ×2 IMPLANT
TRANSDUCER W/STOPCOCK (MISCELLANEOUS) ×2 IMPLANT
TUBING CIL FLEX 10 FLL-RA (TUBING) ×2 IMPLANT
WIRE HI TORQ WHISPER MS 190CM (WIRE) ×1 IMPLANT

## 2018-07-11 NOTE — Progress Notes (Signed)
   07/11/18 1600  Clinical Encounter Type  Visited With Other (Comment)  Visit Type Initial  Referral From Nurse   Responded to [facility] page. PT was taken to cath lab. No contacts at this time.   Chaplain Orest Dikes

## 2018-07-11 NOTE — ED Notes (Signed)
Femoral pulses palpable after shock and CPR. Patient crying and awake stating "it hurts."

## 2018-07-11 NOTE — ED Provider Notes (Addendum)
[facility] Emergency Department Provider Note MRN:  [id number]  Arrival date & time: 07/11/18     Chief Complaint   Chest Pain   History of Present Illness   Erin Cantu is a 52 y.o. year-old female with no pertinent past medical history presenting to the ED with chief complaint of chest pain.  Endorse chest pain prior to arrival, patient became unresponsive prior to my evaluation.  Was found to be pulseless.    I was unable to obtain an accurate HPI, PMH, or ROS due to the patient's cardiac arrest.  Review of Systems  Positive for chest pain, unresponsiveness.  Patient's Health History   No past medical history on file.  Past Surgical History:  Procedure Laterality Date  . ABDOMINAL HYSTERECTOMY  09/2005   done for fibroids    Family History  Problem Relation Age of Onset  . Cancer Paternal Grandmother        breast  . Diabetes Maternal Grandmother   . COPD Father   . Stroke Mother   . Colon cancer Mother   . Cancer Brother        metastatic lung cancer  . Proteinuria Daughter     Social History   Socioeconomic History  . Marital status: Divorced    Spouse name: Not on file  . Number of children: Not on file  . Years of education: Not on file  . Highest education level: Not on file  Occupational History  . Not on file  Social Needs  . Financial resource strain: Not on file  . Food insecurity:    Worry: Not on file    Inability: Not on file  . Transportation needs:    Medical: Not on file    Non-medical: Not on file  Tobacco Use  . Smoking status: Never Smoker  . Smokeless tobacco: Never Used  Substance and Sexual Activity  . Alcohol use: No    Frequency: Never  . Drug use: No  . Sexual activity: Not Currently    Birth control/protection: Post-menopausal, Surgical    Comment: hysterectomy  Lifestyle  . Physical activity:    Days per week: Not on file    Minutes per session: Not on file  . Stress: Not on file  Relationships  .  Social connections:    Talks on phone: Not on file    Gets together: Not on file    Attends religious service: Not on file    Active member of club or organization: Not on file    Attends meetings of clubs or organizations: Not on file    Relationship status: Not on file  . Intimate partner violence:    Fear of current or ex partner: Not on file    Emotionally abused: Not on file    Physically abused: Not on file    Forced sexual activity: Not on file  Other Topics Concern  . Not on file  Social History Narrative  . Not on file     Physical Exam  Vital Signs and Nursing Notes reviewed Vitals:   07/11/18 1513  BP: (!) 110/92  Pulse: 91  Resp: 18  SpO2: 97%    CONSTITUTIONAL: Ill-appearing, unresponsive NEURO: Unresponsive, no response to painful stimuli EYES:  eyes equal and reactive ENT/NECK:  no LAD, no JVD CARDIO: No palpable pulses PULM: Agonal breathing GI/GU:   non-distended, non-tender MSK/SPINE:  No gross deformities, no edema SKIN:  no rash, atraumatic PSYCH: Unable to assess  Diagnostic  and Interventional Summary    EKG Interpretation  Date/Time:  Wednesday July 11 2018 14:54:29 EDT Ventricular Rate:  83 PR Interval:    QRS Duration: 71 QT Interval:  344 QTC Calculation: 405 R Axis:   87 Text Interpretation:  Sinus rhythm Inferior infarct, acute (RCA) Probable RV involvement, suggest recording right precordial leads >>> Acute MI <<< ** ** ACUTE MI / STEMI ** ** Confirmed by Kennis Carina [id number]) on 07/11/2018 3:43:35 PM       Labs Reviewed  CBC WITH DIFFERENTIAL/PLATELET - Abnormal; Notable for the following components:      Result Value   Neutro Abs 1.2 (*)    All other components within normal limits  PROTIME-INR  APTT  COMPREHENSIVE METABOLIC PANEL  TROPONIN I  LIPID PANEL  I-STAT BETA HCG BLOOD, ED ([facility], [facility], [facility] ONLY)    DG Chest Port 1 View    (Results Pending)    Medications  morphine 2 MG/ML injection (has no administration in time  range)  0.9 %  sodium chloride infusion (has no administration in time range)  heparin 5000 UNIT/ML injection (has no administration in time range)  aspirin 81 MG chewable tablet (has no administration in time range)  aspirin 300 MG suppository (has no administration in time range)  heparin 100-0.45 UNIT/ML-% infusion (has no administration in time range)  heparin ADULT infusion 100 units/mL (25000 units/230mL sodium chloride 0.45%) (has no administration in time range)  aspirin chewable tablet 324 mg (324 mg Oral Given 07/11/18 1504)  heparin injection 5,000 Units (5,000 Units Intravenous Given 07/11/18 1502)  morphine 2 MG/ML injection 2 mg (2 mg Intravenous Given 07/11/18 1457)  heparin ADULT infusion 100 units/mL (25000 units/268mL sodium chloride 0.45%) (12 Units/kg/hr  102.1 kg Intravenous New Bag/Given 07/11/18 1511)     Procedures  CPR was performed for a total of 2 minutes.  Defibrillation:  Patient was found to have ventricular fibrillation.  She was defibrillated with 200 J.  Critical Care Critical Care Documentation Critical care time provided by me (excluding procedures): 35 minutes  Condition necessitating critical care: ST elevation myocardial infarction  Components of critical care management: reviewing of prior records, laboratory and imaging interpretation, frequent re-examination and reassessment of vital signs, administration of aspirin, IV heparin, discussion with consulting services    ED Course and Medical Decision Making  I have reviewed the triage vital signs and the nursing notes.  Pertinent labs & imaging results that were available during my care of the patient were reviewed by me and considered in my medical decision making (see below for details).  Patient previously complaining of chest pain found to be unresponsive and pulseless upon my initial exam.  CPR initiated, pads quickly placed, after 1 minute the monitor was checked and revealed ventricular  fibrillation.  Quickly charged 200 J, defibrillation given followed by 1 minute of additional CPR.  Patient then woke and began screaming in pain.  Airway intact, bilateral breath sounds, blood pressure 140 systolic.  EKG postarrest quickly obtained revealing STEMI.  Code STEMI alert initiated, patient given aspirin, heparin bolus, 2 mg morphine for pain.  Accepted by cardiology for transfer directly to the Cath Lab.   Elmer Sow. Pilar Plate, MD [facility] Emergency Medicine [facility] [email or web address]  Final Clinical Impressions(s) / ED Diagnoses     ICD-10-CM   1. ST elevation myocardial infarction (STEMI), unspecified artery (HCC) I21.3   2. 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 Cardiac arrest (HCC) I46.9  ED Discharge Orders    None         Sabas Sous, MD 07/11/18 1520    Sabas Sous, MD 07/11/18 1544

## 2018-07-11 NOTE — H&P (Signed)
Cardiology Admission History and Physical:   Patient ID: Erin Cantu MRN: [id number]; DOB: Sep 10, 1966   Admission date: 07/11/2018  Primary Care Provider: Sigmund Hazel, MD Primary Cardiologist: No primary care provider on file.  Primary Electrophysiologist:  None   Chief Complaint:  Chest pain  Patient Profile:   Erin Cantu is a 52 y.o. female with no past medical problems who presents in transfer from [facility] as a Code Stemi  History of Present Illness:   Erin Cantu was in her usual state of health this afternoon when she developed severe substernal chest pain radiating to both arms.  She describes the chest pain as a pressure-like sensation.  It is quite severe and continues on arrival here, rated at 10/10.  There is associated nausea and vomiting.  She denies shortness of breath.  The patient is quite uncomfortable secondary to pain at the time of my evaluation.  The patient had a cardiac arrest at [facility] secondary to ventricular fibrillation.  She was defibrillated x1 and regained consciousness.  She takes no medications.  She denies any drug allergies.  She has had no recent surgeries.   No past medical history on file.  Past Surgical History:  Procedure Laterality Date  . ABDOMINAL HYSTERECTOMY  09/2005   done for fibroids     Medications Prior to Admission: Prior to Admission medications   Medication Sig Start Date End Date Taking? Authorizing Provider  Cholecalciferol (VITAMIN D3) 5000 units TABS Take 5,000 tablets by mouth daily.    [provider]  ibuprofen (ADVIL,MOTRIN) 200 MG tablet Take 400 mg by mouth every 6 (six) hours as needed for moderate pain.    [provider]  traMADol (ULTRAM) 50 MG tablet Take 1 tablet (50 mg total) by mouth every 6 (six) hours as needed. 07/04/18   Vanetta Mulders, MD     Allergies:   No Known Allergies  Social History:   Social History   Socioeconomic History  . Marital status: Divorced   Spouse name: Not on file  . Number of children: Not on file  . Years of education: Not on file  . Highest education level: Not on file  Occupational History  . Not on file  Social Needs  . Financial resource strain: Not on file  . Food insecurity:    Worry: Not on file    Inability: Not on file  . Transportation needs:    Medical: Not on file    Non-medical: Not on file  Tobacco Use  . Smoking status: Never Smoker  . Smokeless tobacco: Never Used  Substance and Sexual Activity  . Alcohol use: No    Frequency: Never  . Drug use: No  . Sexual activity: Not Currently    Birth control/protection: Post-menopausal, Surgical    Comment: hysterectomy  Lifestyle  . Physical activity:    Days per week: Not on file    Minutes per session: Not on file  . Stress: Not on file  Relationships  . Social connections:    Talks on phone: Not on file    Gets together: Not on file    Attends religious service: Not on file    Active member of club or organization: Not on file    Attends meetings of clubs or organizations: Not on file    Relationship status: Not on file  . Intimate partner violence:    Fear of current or ex partner: Not on file    Emotionally abused: Not on  file    Physically abused: Not on file    Forced sexual activity: Not on file  Other Topics Concern  . Not on file  Social History Narrative  . Not on file    Family History:   The patient's family history includes COPD in her father; Cancer in her brother and paternal grandmother; Colon cancer in her mother; Diabetes in her maternal grandmother; Proteinuria in her daughter; Stroke in her mother.    ROS:  Please see the history of present illness.  All other ROS reviewed and negative.     Physical Exam/Data:   Vitals:   07/11/18 1500 07/11/18 1513 07/11/18 1526  BP:  (!) 110/92 (!) 145/84  Pulse:  91 94  Resp:  18 18  Temp:   98 F (36.7 C)  TempSrc:   Oral  SpO2:  97% 100%  Weight: 102 kg    Height: 5'  2" (1.575 m)     No intake or output data in the 24 hours ending 07/11/18 1555 Filed Weights   07/11/18 1500  Weight: 102 kg   Body mass index is 41.13 kg/m.  General:  Well nourished, well developed, the patient is in moderate distress secondary to pain. HEENT: normal Lymph: no adenopathy Neck: no JVD Endocrine:  No thryomegaly Vascular: No carotid bruits; FA pulses 2+ bilaterally without bruits  Cardiac:  normal S1, S2; RRR; no murmur  Lungs:  clear to auscultation bilaterally, no wheezing, rhonchi or rales  Abd: soft, nontender, no hepatomegaly  Ext: no edema Musculoskeletal:  No deformities, BUE and BLE strength normal and equal Skin: warm and dry  Neuro:  CNs 2-12 intact, no focal abnormalities noted Psych:  Normal affect    EKG:  The ECG that was done today was personally reviewed and demonstrates normal sinus rhythm with acute inferior injury consistent with STEMI  Relevant CV Studies: None  Laboratory Data:  Chemistry Recent Labs  Lab 07/11/18 1500  NA 137  K 3.2*  CL 105  CO2 21*  GLUCOSE 183*  BUN 9  CREATININE 0.96  CALCIUM 8.6*  GFRNONAA >60  GFRAA >60  ANIONGAP 11    Recent Labs  Lab 07/11/18 1500  PROT 7.0  ALBUMIN 3.8  AST 106*  ALT 104*  ALKPHOS 88  BILITOT 0.5   Hematology Recent Labs  Lab 07/11/18 1500  WBC 4.8  RBC 4.62  HGB 13.5  HCT 42.1  MCV 91.1  MCH 29.2  MCHC 32.1  RDW 13.3  PLT 231   Cardiac Enzymes Recent Labs  Lab 07/04/18 1801 07/11/18 1500  TROPONINI <0.03 <0.03    Recent Labs  Lab 07/11/18 1504  TROPIPOC 0.03    BNPNo results for input(s): BNP, PROBNP in the last 168 hours.  DDimer  Recent Labs  Lab 07/04/18 1801  DDIMER 0.44    Radiology/Studies:  No results found.  Assessment and Plan:   68. 52 year old woman with acute inferior STEMI.  This is complicated by ventricular fibrillation cardiac arrest.  She is now hemodynamically stable but continues to have 10/10 chest pain.  Plan for  emergency cardiac catheterization and PCI.  She has received 5000 units of IV unfractionated heparin and is also received aspirin 324 mg.  Further plans pending her cardiac catheterization results.  Emergency implied consent is obtained.  Severity of Illness: The appropriate patient status for this patient is INPATIENT. Inpatient status is judged to be reasonable and necessary in order to provide the required intensity of service to  ensure the patient's safety. The patient's presenting symptoms, physical exam findings, and initial radiographic and laboratory data in the context of their chronic comorbidities is felt to place them at high risk for further clinical deterioration. Furthermore, it is not anticipated that the patient will be medically stable for discharge from the hospital within 2 midnights of admission. The following factors support the patient status of inpatient.   * I certify that at the point of admission it is my clinical judgment that the patient will require inpatient hospital care spanning beyond 2 midnights from the point of admission due to high intensity of service, high risk for further deterioration and high frequency of surveillance required.*    For questions or updates, please contact [facility] Please consult [email or web address] for contact info under        Signed, Tonny Bollman, MD  07/11/2018 3:55 PM

## 2018-07-11 NOTE — Progress Notes (Signed)
Assessed pts. Vasculature with [place] for placing another PIV. Unable to gain access at this time. [facility] RN notified. She reported that pt at this time did not need any further access. Instructed Haley to contact us with any further needs. She VU. Tomasita Morrow, RN VAST

## 2018-07-11 NOTE — ED Notes (Signed)
Called [facility] for Code Stemi.  Called [facility] for transport to [facility] as requested.

## 2018-07-11 NOTE — Progress Notes (Addendum)
Pt having intense pain from IV site in R. AC, and says it starts near IV site, and moves up her arm. This has occurred with IV fluids running at 75 cc/hr, before starting the ordered infusion of potassium. After infusing potassium for a few minutes, pain worsened.  Fluids paused and an IV team consult initiated to insert a new IV in other arm.

## 2018-07-11 NOTE — ED Notes (Signed)
Patient being placed om monitor, responding to Triage screening. Sudden LOC with agonal breathing. Pulse thready initially then pulseless. CPR started. Patient with rigid limbs, difficulty to clear oral secretions. Ventilations with bag mask started. EDP at bedside. Code Blue in progress. Patient rolled to place pads.

## 2018-07-11 NOTE — Progress Notes (Signed)
ANTICOAGULATION CONSULT NOTE - Initial Consult  Pharmacy Consult for Heparin Indication: chest pain/ACS  No Known Allergies  Patient Measurements: Height: 5\' 2"  (157.5 cm) Weight: 224 lb 13.9 oz (102 kg) IBW/kg (Calculated) : 50.1 HEPARIN DW (KG): 74.4  Vital Signs:    Labs: Recent Labs    07/11/18 1500  HGB 13.5  HCT 42.1  PLT 231    Estimated Creatinine Clearance: 91.9 mL/min (by C-G formula based on SCr of 0.82 mg/dL).   Medical History: No past medical history on file.  Medications:  See med rec  Assessment: 52 yo female presented with chest pain. Baseline labs pending. Pharmacy asked to start heparin  Goal of Therapy:  Heparin level 0.3-0.7 units/ml Monitor platelets by anticoagulation protocol: Yes   Plan:  Heparin 5000 units bolus already given Start heparin infusion at 900 units/hr Check anti-Xa level in 6-8 hours and daily while on heparin Continue to monitor H&H and platelets  Elder Cyphers, BS Loura Back, BCPS Clinical Pharmacist Pager [phone] 07/11/2018,3:13 PM

## 2018-07-11 NOTE — ED Triage Notes (Signed)
Patient reports centralized chest pain with radiation to both arms, onset 1330.

## 2018-07-11 NOTE — ED Notes (Signed)
Patient shocked at 200 J. CPR resumed after shock. Erin Cantu

## 2018-07-12 ENCOUNTER — Encounter (HOSPITAL_COMMUNITY): Payer: Self-pay | Admitting: Cardiovascular Disease

## 2018-07-12 ENCOUNTER — Other Ambulatory Visit: Payer: Self-pay

## 2018-07-12 ENCOUNTER — Other Ambulatory Visit (HOSPITAL_COMMUNITY): Payer: 59

## 2018-07-12 LAB — BASIC METABOLIC PANEL
Anion gap: 7 (ref 5–15)
BUN: 5 mg/dL — AB (ref 6–20)
CHLORIDE: 107 mmol/L (ref 98–111)
CO2: 25 mmol/L (ref 22–32)
Calcium: 8.9 mg/dL (ref 8.9–10.3)
Creatinine, Ser: 0.97 mg/dL (ref 0.44–1.00)
GFR calc Af Amer: >60 mL/min (ref >60)
GLUCOSE: 140 mg/dL — AB (ref 70–99)
Potassium: 4 mmol/L (ref 3.5–5.1)
Sodium: 139 mmol/L (ref 135–145)

## 2018-07-12 LAB — CBC
HEMATOCRIT: 39.2 % (ref 36.0–46.0)
Hemoglobin: 12.3 g/dL (ref 12.0–15.0)
MCH: 29.3 pg (ref 26.0–34.0)
MCHC: 31.4 g/dL (ref 30.0–36.0)
MCV: 93.3 fL (ref 78.0–100.0)
Platelets: 231 10*3/uL (ref 150–400)
RBC: 4.2 MIL/uL (ref 3.87–5.11)
RDW: 13.2 % (ref 11.5–15.5)
WBC: 8.7 10*3/uL (ref 4.0–10.5)

## 2018-07-12 LAB — HIV ANTIBODY (ROUTINE TESTING W REFLEX): HIV Screen 4th Generation wRfx: NONREACTIVE

## 2018-07-12 LAB — TROPONIN I
TROPONIN I: 11.29 ng/mL — AB (ref <0.03)
Troponin I: 20.77 ng/mL (ref <0.03)

## 2018-07-12 LAB — LIPID PANEL
Cholesterol: 194 mg/dL (ref 0–200)
HDL: 57 mg/dL (ref >40)
LDL CALC: 125 mg/dL — AB (ref 0–99)
TRIGLYCERIDES: 59 mg/dL (ref <150)
Total CHOL/HDL Ratio: 3.4 RATIO
VLDL: 12 mg/dL (ref 0–40)

## 2018-07-12 MED FILL — Verapamil HCl IV Soln 2.5 MG/ML: INTRAVENOUS | Qty: 2 | Status: AC

## 2018-07-12 MED FILL — Medication: Qty: 1 | Status: AC

## 2018-07-12 NOTE — Progress Notes (Addendum)
Progress Note  Patient Name: Erin Cantu Date of Encounter: 07/12/2018  Primary Cardiologist: No primary care provider on file.  Subjective   Feeling Better this AM. Denies chest pain or SOB. Does endorse some chest soreness (from CPR yesterday).  Inpatient Medications    Scheduled Meds: . aspirin EC  81 mg Oral Daily  . atorvastatin  80 mg Oral q1800  . heparin  5,000 Units Subcutaneous Q8H  . metoprolol tartrate  12.5 mg Oral BID  . sodium chloride flush  3 mL Intravenous Q12H  . ticagrelor  90 mg Oral BID   Continuous Infusions: . sodium chloride     PRN Meds: sodium chloride, acetaminophen, nitroGLYCERIN, ondansetron (ZOFRAN) IV, sodium chloride flush   Vital Signs    Vitals:   07/12/18 0700 07/12/18 0741 07/12/18 0800 07/12/18 0900  BP:   137/77 119/67  Pulse: 65  68 78  Resp: 15  14 15   Temp:  98.5 F (36.9 C)    TempSrc:  Oral    SpO2: 98%  100% 100%  Weight:      Height:        Intake/Output Summary (Last 24 hours) at 07/12/2018 1021 Last data filed at 07/12/2018 0400 Gross per 24 hour  Intake 452.99 ml  Output -  Net 452.99 ml   Filed Weights   07/11/18 1500  Weight: 102 kg    Telemetry    NSR, few pair and quartets of PVCs  - Personally Reviewed  ECG    NSR, T-wave flattening in III - Personally Reviewed  Physical Exam   GEN: No acute distress.   Neck: No JVD Cardiac: RRR, no murmurs, rubs, or gallops.  Respiratory: Clear to auscultation bilaterally. GI: Soft, nontender, non-distended  MS: No edema; No deformity; R-radial cath site clean, dry, no hematoma Neuro:  Nonfocal  Psych: Normal affect   Labs    Chemistry Recent Labs  Lab 07/11/18 1500 07/11/18 1858 07/12/18 0634  NA 137  --  139  K 3.2*  --  4.0  CL 105  --  107  CO2 21*  --  25  GLUCOSE 183*  --  140*  BUN 9  --  5*  CREATININE 0.96 0.98 0.97  CALCIUM 8.6*  --  8.9  PROT 7.0  --   --   ALBUMIN 3.8  --   --   AST 106*  --   --   ALT 104*  --   --     ALKPHOS 88  --   --   BILITOT 0.5  --   --   GFRNONAA >60 >60 >60  GFRAA >60 >60 >60  ANIONGAP 11  --  7     Hematology Recent Labs  Lab 07/11/18 1500 07/11/18 1858 07/12/18 0634  WBC 4.8 11.9* 8.7  RBC 4.62 4.61 4.20  HGB 13.5 13.3 12.3  HCT 42.1 42.4 39.2  MCV 91.1 92.0 93.3  MCH 29.2 28.9 29.3  MCHC 32.1 31.4 31.4  RDW 13.3 13.2 13.2  PLT 231 256 231    Cardiac Enzymes Recent Labs  Lab 07/11/18 1500 07/11/18 1858 07/12/18 0042 07/12/18 0634  TROPONINI <0.03 13.39* 20.77* 11.29*    Recent Labs  Lab 07/11/18 1504  TROPIPOC 0.03     BNPNo results for input(s): BNP, PROBNP in the last 168 hours.   DDimer No results for input(s): DDIMER in the last 168 hours.   Radiology    No results found.  Cardiac Studies  Cath 10/2  Ost 2nd Mrg to 2nd Mrg lesion is 100% stenosed.  Balloon angioplasty was performed using a BALLOON SAPPHIRE 2.0X12.  Post intervention, there is a 25% residual stenosis.  The left ventricular systolic function is normal.  LV end diastolic pressure is mildly elevated.  The left ventricular ejection fraction is 55-65% by visual estimate.  Echo today  Patient Profile     52 y.o. female Presented with chest pain to [facility], found to have STEMI, s/p v-fib arrest at AP cardioverted after 1 shock. 100% Occlusion of 2nd Marginal on Cath, s/p balloon angioplasty with 25% residual.  Assessment & Plan    1) STEMI, V-fib Arrest: Inferior MI, V-fib arrest at AP. Now s/o balloon angioplasty with 25% residual stenosis of 2nd Marginal. EF normal on cath echo pending. BP 110s systolic this AM, will hold off on ACE-I therapy for now. K improved this AM with repletion. - DAPT: ASA/Brilinta - Metoprolol 12.5mg  BID - Atorvastatin 80mg  Daily   For questions or updates, please contact [facility] Please consult [email or web address] for contact info under        Signed, Beola Cord, MD  07/12/2018, 10:21 AM    I have examined the patient and  reviewed assessment and plan and discussed with patient.  Agree with above as stated.  I personally reviewed the cath films.  OK to transfer to tele. POssible d/c in AM.  He is very concerned about finances.  WiIl get case manager consult. She would benefit from weight loss.  She requested to be checked for STD as well.   Lance Muss

## 2018-07-12 NOTE — Progress Notes (Signed)
CARDIAC REHAB PHASE I   PRE:  Rate/Rhythm: 68 SR with PVCs    BP: sitting 115/77    SaO2:   MODE:  Ambulation: 370 ft   POST:  Rate/Rhythm: 82 SR    BP: sitting 133/69     SaO2:   Tolerated well, no major c/o. Does st her chest is sore with movement/touch when asked. No angina sx. Pt is emotional and stressed, esp with losing her job in September and the worry of finances. Her insurance ran out 07/09/18. CM to see later. Discussed MI, Brilinta, risk factor modification including diet and ex, stress reduction, NTG and CRPII. Pt voiced understanding and I will refer to [facility] CRPII. She is most worried about med cost so generic meds will help. [phone]   Erin Cantu CES, ACSM 07/12/2018 11:32 AM

## 2018-07-12 NOTE — Plan of Care (Signed)
  Problem: Education: Goal: Knowledge of General Education information will improve Description Including pain rating scale, medication(s)/side effects and non-pharmacologic comfort measures Outcome: Progressing   Problem: Clinical Measurements: Goal: Ability to maintain clinical measurements within normal limits will improve Outcome: Progressing   Problem: Elimination: Goal: Will not experience complications related to bowel motility Outcome: Progressing   Problem: Pain Managment: Goal: General experience of comfort will improve Outcome: Progressing   Problem: Safety: Goal: Ability to remain free from injury will improve Outcome: Progressing   Problem: Skin Integrity: Goal: Risk for impaired skin integrity will decrease Outcome: Progressing   

## 2018-07-12 NOTE — Care Management Note (Addendum)
Case Management Note  Patient Details  Name: Erin Cantu MRN: [id number] Date of Birth: Apr 08, 1966  Subjective/Objective: 52 y.o. female presented with chest pain; s/p STEMI.                  Action/Plan: CM consult acknowledged. CM met with patient/daughter to discuss transitional needs. Patient lives at home and was independent PTA. Patient reports recently lost her job and her insurance ended on 07/09/18. Patient verbalized her PCP as: Dr. Kathyrn Lass; pharmacy of choice: [facility]. CM offered to arrange a hospital f/u appointment, d/t recent change in her finances/employment, with patient agreeable. Appointment arranged with: Patient [place] on 07/24/18 @ 1300; patient can utilize [facility] for her Rx needs. Brilinta 30 day free card provided and the [employer] patient assistance application. CM discussed the [place] service, with patient requesting her Rx be filled prior to her transition home. Patient's daughter will provide transportation home. CM will continue to follow.   Expected Discharge Date:                  Expected Discharge Plan:  Home/Self Care  In-House Referral:  NA  Discharge planning Services  CM Consult, Follow-up appt scheduled, Other - See comment, Medication Assistance(Brilinta card provided; [place] service)  Post Acute Care Choice:  NA Choice offered to:  NA  DME Arranged:  N/A DME Agency:  NA  HH Arranged:  NA HH Agency:  NA  Status of Service:  In process, will continue to follow  If discussed at Long Length of Stay Meetings, dates discussed:    Additional Comments:  Midge Minium RN, BSN, NCM-BC, ACM-RN [phone] 07/12/2018, 3:30 PM

## 2018-07-12 NOTE — Care Management (Signed)
#  1.   S/W    J.C  @ [employer]  # [phone]  TICAGRELOR : NONE FORMULARY  BRILINTA   90 MG  BID COVER- YES CO-PAY- $ 245.00  20 % OF TOTAL COST TIER- NO PRIOR APPROVAL- NO  DEDUCTIBLE: NOT MET   BAL $ 90.81  PREFERRED PHARMACY : YES [facility] AND [facility]

## 2018-07-13 ENCOUNTER — Other Ambulatory Visit: Payer: Self-pay

## 2018-07-13 ENCOUNTER — Inpatient Hospital Stay (HOSPITAL_COMMUNITY): Payer: 59

## 2018-07-13 DIAGNOSIS — E785 Hyperlipidemia, unspecified: Secondary | ICD-10-CM

## 2018-07-13 DIAGNOSIS — I10 Essential (primary) hypertension: Secondary | ICD-10-CM

## 2018-07-13 DIAGNOSIS — E782 Mixed hyperlipidemia: Secondary | ICD-10-CM

## 2018-07-13 MED ORDER — METRONIDAZOLE 500 MG PO TABS
2000.0000 mg | ORAL_TABLET | Freq: Once | ORAL | Status: AC
  Administered 2018-07-13: 2000 mg via ORAL
  Filled 2018-07-13: qty 4

## 2018-07-13 MED ORDER — METOPROLOL TARTRATE 25 MG PO TABS: 12.5000 mg | ORAL_TABLET | Freq: Two times a day (BID) | ORAL | 1 refills | Status: DC

## 2018-07-13 MED ORDER — ATORVASTATIN CALCIUM 80 MG PO TABS: 80.0000 mg | ORAL_TABLET | Freq: Every day | ORAL | 1 refills | Status: DC

## 2018-07-13 MED ORDER — ASPIRIN 81 MG PO TBEC: 81.0000 mg | DELAYED_RELEASE_TABLET | Freq: Every day | ORAL | Status: DC

## 2018-07-13 MED ORDER — TICAGRELOR 90 MG PO TABS: 90.0000 mg | ORAL_TABLET | Freq: Two times a day (BID) | ORAL | 11 refills | Status: DC

## 2018-07-13 MED ORDER — NITROGLYCERIN 0.4 MG SL SUBL: 0.4000 mg | SUBLINGUAL_TABLET | SUBLINGUAL | 2 refills | Status: DC | PRN

## 2018-07-13 MED FILL — BRILINTA 90 MG TABLET: 90 | 30 days supply | Qty: 60 | Fill #0 | Status: TO

## 2018-07-13 MED FILL — METOPROLOL TARTRATE 25 MG T: 25 | 30 days supply | Qty: 30 | Fill #0 | Status: TO

## 2018-07-13 MED FILL — NITROGLYCERIN 0.4 MG TAB SL: 0.4 | 25 days supply | Qty: 25 | Fill #0 | Status: TO

## 2018-07-13 MED FILL — ATORVASTATIN CALCIUM 80 MG: 80 | 30 days supply | Qty: 30 | Fill #0 | Status: TO

## 2018-07-13 NOTE — Progress Notes (Addendum)
Progress Note  Patient Name: Erin Cantu Date of Encounter: 07/13/2018  Primary Cardiologist: No primary care provider on file.  Subjective   Feeling well this AM. Denies chest pain or SOB. Continues to endorse some chest tenderness on palpation.  Inpatient Medications    Scheduled Meds: . aspirin EC  81 mg Oral Daily  . atorvastatin  80 mg Oral q1800  . heparin  5,000 Units Subcutaneous Q8H  . metoprolol tartrate  12.5 mg Oral BID  . sodium chloride flush  3 mL Intravenous Q12H  . ticagrelor  90 mg Oral BID   Continuous Infusions: . sodium chloride     PRN Meds: sodium chloride, acetaminophen, nitroGLYCERIN, ondansetron (ZOFRAN) IV, sodium chloride flush   Vital Signs    Vitals:   07/12/18 1600 07/12/18 1719 07/12/18 2053 07/13/18 0557  BP: 126/75 125/69 136/83 131/85  Pulse:  77 60 (!) 57  Resp: (!) 25     Temp:  98.1 F (36.7 C) 98.3 F (36.8 C) 98.1 F (36.7 C)  TempSrc:  Oral Oral Oral  SpO2:  100% 100% 97%  Weight:      Height:        Intake/Output Summary (Last 24 hours) at 07/13/2018 0816 Last data filed at 07/12/2018 2130 Gross per 24 hour  Intake 240 ml  Output -  Net 240 ml   Filed Weights   07/11/18 1500  Weight: 102 kg    Telemetry    NSR  - Personally Reviewed  ECG    NSR, Inferiorolateral T-wave inversion - Personally Reviewed  Physical Exam   GEN: No acute distress.   Neck: No JVD Cardiac: RRR, no murmurs, rubs, or gallops.  Respiratory: Clear to auscultation bilaterally. GI: Soft, nontender, non-distended  MS: No edema; No deformity; R-radial cath site clean, dry, no hematoma Neuro:  Nonfocal  Psych: Normal affect   Labs    Chemistry Recent Labs  Lab 07/11/18 1500 07/11/18 1858 07/12/18 0634  NA 137  --  139  K 3.2*  --  4.0  CL 105  --  107  CO2 21*  --  25  GLUCOSE 183*  --  140*  BUN 9  --  5*  CREATININE 0.96 0.98 0.97  CALCIUM 8.6*  --  8.9  PROT 7.0  --   --   ALBUMIN 3.8  --   --   AST 106*  --   --    ALT 104*  --   --   ALKPHOS 88  --   --   BILITOT 0.5  --   --   GFRNONAA >60 >60 >60  GFRAA >60 >60 >60  ANIONGAP 11  --  7     Hematology Recent Labs  Lab 07/11/18 1500 07/11/18 1858 07/12/18 0634  WBC 4.8 11.9* 8.7  RBC 4.62 4.61 4.20  HGB 13.5 13.3 12.3  HCT 42.1 42.4 39.2  MCV 91.1 92.0 93.3  MCH 29.2 28.9 29.3  MCHC 32.1 31.4 31.4  RDW 13.3 13.2 13.2  PLT 231 256 231    Cardiac Enzymes Recent Labs  Lab 07/11/18 1500 07/11/18 1858 07/12/18 0042 07/12/18 0634  TROPONINI <0.03 13.39* 20.77* 11.29*    Recent Labs  Lab 07/11/18 1504  TROPIPOC 0.03     BNPNo results for input(s): BNP, PROBNP in the last 168 hours.   DDimer No results for input(s): DDIMER in the last 168 hours.   Radiology    No results found.  Cardiac Studies  Cath 10/2  Ost 2nd Mrg to 2nd Mrg lesion is 100% stenosed.  Balloon angioplasty was performed using a BALLOON SAPPHIRE 2.0X12.  Post intervention, there is a 25% residual stenosis.  The left ventricular systolic function is normal.  LV end diastolic pressure is mildly elevated.  The left ventricular ejection fraction is 55-65% by visual estimate.  Echo today  Patient Profile     52 y.o. female Presented with chest pain to AP, found to have STEMI, s/p v-fib arrest at AP cardioverted after 1 shock. 100% Occlusion of 2nd Marginal on Cath, s/p balloon angioplasty with 25% residual.  Assessment & Plan    1) STEMI, V-fib Arrest: Inferior MI, V-fib arrest at AP. Now s/o balloon angioplasty with 25% residual stenosis of 2nd Marginal. EF normal on cath echo pending. BP 130s systolic today, but has been softer. Holding will hold off on ACE-I therapy for now. Likely home today. - DAPT: ASA/Brilinta - Metoprolol 12.5mg  BID - Atorvastatin 80mg  Daily  2) Trichomoniasis: Patient reports fishy odor and discharge. Will treat with empiric metronidazole and have patient follow up with PCP.   For questions or updates, please contact  [facility] Please consult [email or web address] for contact info under        Signed, Beola Cord, MD  07/13/2018, 8:16 AM    I have examined the patient and reviewed assessment and plan and discussed with patient.  Agree with above as stated.  Doing well from a cardiac standpoint.  COntinue aggressive secondary prevention.   Stressed importance of cardiac rehab and meds. Plan for discharge later today.  Lance Muss

## 2018-07-13 NOTE — Progress Notes (Signed)
Discharge teaching complete. Meds, diet, activity follow up appointments and TR band care reviewed and all questions answered. Copy of instructions given to patient and meds delivered to patient by pharmacy.

## 2018-07-13 NOTE — Progress Notes (Signed)
CARDIAC REHAB PHASE I   PRE:  Rate/Rhythm: 70 SR    BP: sitting 118/65    SaO2:   MODE:  Ambulation: 470 ft   POST:  Rate/Rhythm: 99 SR    BP: sitting 132/79     SaO2:   Pt walked quicker today. Some SOB and fatigue toward end but overall did well. Answered all questions, pt eager to d/c and work on diet/ex. [id number]   Harriet Masson CES, ACSM 07/13/2018 10:05 AM

## 2018-07-13 NOTE — Discharge Summary (Addendum)
Discharge Summary    Patient ID: Erin Cantu,  MRN: [id number], DOB/AGE: [birth year] 52 y.o.  Admit date: 07/11/2018 Discharge date: 07/13/2018  Primary Care Provider: Sigmund Hazel Primary Cardiologist: Dr. Excell Seltzer   Discharge Diagnoses    Active Problems:   Acute ST elevation myocardial infarction (STEMI) of inferior wall (HCC)   ST elevation myocardial infarction (STEMI) of inferior wall, initial episode of care [facility])   Hyperlipidemia   Hypertension  Allergies No Known Allergies  Diagnostic Studies/Procedures    Cath: 07/11/18   Ost 2nd Mrg to 2nd Mrg lesion is 100% stenosed.  Balloon angioplasty was performed using a BALLOON SAPPHIRE 2.0X12.  Post intervention, there is a 25% residual stenosis.  The left ventricular systolic function is normal.  LV end diastolic pressure is mildly elevated.  The left ventricular ejection fraction is 55-65% by visual estimate.   1. Widely patent/angiographically normal left main, RCA, and LAD 2. 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 Mild contraction abnormality of the left ventricle with preserved overall LVEF  Recommend uninterrupted dual antiplatelet therapy with Aspirin 81mg  daily and Ticagrelor 90mg  twice daily for a minimum of 12 months (ACS - Class I recommendation). _____________   History of Present Illness     Erin Cantu is a 52 yo female with no PMH who was in her usual state of health the afternoon of admission when she developed severe substernal chest pain radiating to both arms.  She described the chest pain as a pressure-like sensation.  It was quite severe and continued on arrival to the ED, rated at 10/10.  There was associated nausea and vomiting.  She denied shortness of breath.  The patient was quite uncomfortable secondary to pain at the time of evaluation.  The  patient had a cardiac arrest at [facility] secondary to ventricular fibrillation.  She was defibrillated x1 and regained consciousness.  She takes no medications.  She denied any drug allergies. She has had no recent surgeries. Given presentation she was brought directly to the cath lab for emergent cath.   Hospital Course     Underwent cath noted above with total occlusion of the second OM with balloon angioplasty with no stenting preformed because of small vessel size. Plan for DAPT with ASA/Brilinta for at least one year. Troponin peaked at 20.77. LDL noted at 125, and placed on high dose statin. Also added metoprolol 12.5mg  BID with blood pressure tolerating. LV gram noted a normal EF. Worked well with cardiac rehab without recurrent chest pain. Of note, also reported vaginal odor and was empirical treated with 2g Flagyl prior to discharge.   Erin Cantu was seen by Dr. Eldridge Dace and determined stable for discharge home. Follow up in the office has been arranged. Medications are listed below.   _____________  Discharge Vitals Blood pressure 132/79, pulse (!) 57, temperature 98.1 F (36.7 C), temperature source Oral, resp. rate (!) 25, height 5\' 2"  (1.575 m), weight 102 kg, SpO2 97 %.  Filed Weights   07/11/18 1500  Weight: 102 kg    Labs & Radiologic Studies    CBC Recent Labs    07/11/18 1500 07/11/18 1858 07/12/18 0634  WBC 4.8 11.9* 8.7  NEUTROABS 1.2*  --   --   HGB 13.5 13.3 12.3  HCT 42.1 42.4 39.2  MCV 91.1 92.0 93.3  PLT 231 256 231  Basic Metabolic Panel Recent Labs    16/10/96 1500 07/11/18 1858 07/12/18 0634  NA 137  --  139  K 3.2*  --  4.0  CL 105  --  107  CO2 21*  --  25  GLUCOSE 183*  --  140*  BUN 9  --  5*  CREATININE 0.96 0.98 0.97  CALCIUM 8.6*  --  8.9   Liver Function Tests Recent Labs    07/11/18 1500  AST 106*  ALT 104*  ALKPHOS 88  BILITOT 0.5  PROT 7.0  ALBUMIN 3.8   No results for input(s): LIPASE, AMYLASE in the last 72  hours. Cardiac Enzymes Recent Labs    07/11/18 1858 07/12/18 0042 07/12/18 0634  TROPONINI 13.39* 20.77* 11.29*   BNP Invalid input(s): POCBNP D-Dimer No results for input(s): DDIMER in the last 72 hours. Hemoglobin A1C No results for input(s): HGBA1C in the last 72 hours. Fasting Lipid Panel Recent Labs    07/12/18 0634  CHOL 194  HDL 57  LDLCALC 125*  TRIG 59  CHOLHDL 3.4   Thyroid Function Tests No results for input(s): TSH, T4TOTAL, T3FREE, THYROIDAB in the last 72 hours.  Invalid input(s): FREET3 _____________  Dg Chest 2 View  Result Date: 07/04/2018 CLINICAL DATA:  Acute onset of mid chest pain which the patient states may be anxiety induced. The chest pain began yesterday. EXAM: CHEST - 2 VIEW COMPARISON:  None. FINDINGS: Cardiomediastinal silhouette unremarkable. Small nodule overlying the RIGHT lung base on the PA image, not localized on the LATERAL image, dense for its small size. Lungs otherwise clear. Bronchovascular markings normal. Pulmonary vascularity normal. No pleural effusions. Minimal degenerative changes involving the thoracic spine and slight thoracic dextroscoliosis. IMPRESSION: 1. Small nodule overlying the RIGHT lung base on the PA image, not localized on the LATERAL image. Given its density for its small size, this likely represents a benign calcified granuloma. A non-contrast CT chest may be confirmatory. 2.  No acute cardiopulmonary disease. Electronically Signed   By: Hulan Saas M.D.   On: 07/04/2018 14:50   Disposition   Pt is being discharged home today in good condition.  Follow-up Plans & Appointments    Follow-up Information    [facility] Follow up.   Why:  For medication needs Contact information: [address] [place] [id number] [phone]       [facility]. Go on 07/24/2018.   Specialty:  Internal Medicine Why:  at 1pm.  Contact  information: [address] [place] [address] [phone]       Tonny Bollman, MD Follow up on 07/27/2018.   Specialty:  Cardiology Why:  at 3:20pm for your follow up appt.  Contact information: [address] [place] [address] [phone]          Discharge Instructions    Amb Referral to Cardiac Rehabilitation   Complete by:  As directed    Diagnosis:   STEMI PTCA     Call MD for:  redness, tenderness, or signs of infection (pain, swelling, redness, odor or green/yellow discharge around incision site)   Complete by:  As directed    Diet - low sodium heart healthy   Complete by:  As directed    Discharge instructions   Complete by:  As directed    Radial Site Care Refer to this sheet in the next few weeks. These instructions provide you with information  on caring for yourself after your procedure. Your caregiver may also give you more specific instructions. Your treatment has been planned according to current medical practices, but problems sometimes occur. Call your caregiver if you have any problems or questions after your procedure. HOME CARE INSTRUCTIONS You may shower the day after the procedure.Remove the bandage (dressing) and gently wash the site with plain soap and water.Gently pat the site dry.  Do not apply powder or lotion to the site.  Do not submerge the affected site in water for 3 to 5 days.  Inspect the site at least twice daily.  Do not flex or bend the affected arm for 24 hours.  No lifting over 5 pounds (2.3 kg) for 10 days after your procedure.  Do not drive home if you are discharged the same day of the procedure. Have someone else drive you.  You may drive 48 hours after the procedure unless otherwise instructed by your caregiver.  What to expect: Any bruising will usually fade within 1 to 2 weeks.  Blood that collects in the tissue (hematoma) may be painful to the touch. It should usually decrease in size  and tenderness within 1 to 2 weeks.  SEEK IMMEDIATE MEDICAL CARE IF: You have unusual pain at the radial site.  You have redness, warmth, swelling, or pain at the radial site.  You have drainage (other than a small amount of blood on the dressing).  You have chills.  You have a fever or persistent symptoms for more than 72 hours.  You have a fever and your symptoms suddenly get worse.  Your arm becomes pale, cool, tingly, or numb.  You have heavy bleeding from the site. Hold pressure on the site.   Increase activity slowly   Complete by:  As directed        Discharge Medications     Medication List    STOP taking these medications   alum & mag hydroxide-simeth 200-200-20 MG/5ML suspension Commonly known as:  MAALOX/MYLANTA   traMADol 50 MG tablet Commonly known as:  ULTRAM     TAKE these medications   aspirin 81 MG EC tablet Take 1 tablet (81 mg total) by mouth daily. Start taking on:  07/14/2018   atorvastatin 80 MG tablet Commonly known as:  LIPITOR Take 1 tablet (80 mg total) by mouth daily at 6 PM.   metoprolol tartrate 25 MG tablet Commonly known as:  LOPRESSOR Take 0.5 tablets (12.5 mg total) by mouth 2 (two) times daily.   nitroGLYCERIN 0.4 MG SL tablet Commonly known as:  NITROSTAT Place 1 tablet (0.4 mg total) under the tongue every 5 (five) minutes x 3 doses as needed for chest pain.   ticagrelor 90 MG Tabs tablet Commonly known as:  BRILINTA Take 1 tablet (90 mg total) by mouth 2 (two) times daily.       Acute coronary syndrome (MI, NSTEMI, STEMI, etc) this admission?: Yes.     AHA/ACC Clinical Performance & Quality Measures: 1. Aspirin prescribed? - Yes 2. ADP Receptor Inhibitor (Plavix/Clopidogrel, Brilinta/Ticagrelor or Effient/Prasugrel) prescribed (includes medically managed patients)? - Yes 3. Beta Blocker prescribed? - Yes 4. High Intensity Statin (Lipitor 40-80mg  or Crestor 20-40mg ) prescribed? - Yes 5. EF assessed during THIS  hospitalization? - Yes 6. For EF <40%, was ACEI/ARB prescribed? - Not Applicable (EF >/= 40%) 7. For EF <40%, Aldosterone Antagonist (Spironolactone or Eplerenone) prescribed? - Not Applicable (EF >/= 40%) 8. Cardiac Rehab Phase II ordered (Included Medically managed Patients)? -  Yes      Outstanding Labs/Studies   LFT/FLP in 6 weeks if tolerating statin.   Duration of Discharge Encounter   Greater than 30 minutes including physician time.  Signed, Laverda Page NP-C 07/13/2018, 1:12 PM  I have examined the patient and reviewed assessment and plan and discussed with patient.  Agree with above as stated.  Doing well from a cardiac standpoint.  COntinue aggressive secondary prevention.   Stressed importance of cardiac rehab and meds. Plan for discharge later today.  Lance Muss

## 2018-07-18 ENCOUNTER — Telehealth: Payer: Self-pay | Admitting: *Deleted

## 2018-07-18 ENCOUNTER — Ambulatory Visit: Payer: 59 | Admitting: Physician Assistant

## 2018-07-18 ENCOUNTER — Encounter: Payer: Self-pay | Admitting: Physician Assistant

## 2018-07-18 NOTE — Telephone Encounter (Signed)
Tried to contact pt re: todays appt, 07/18/18. Looks as if there are sone confusion with her appts and pt didn't show up and I was going to make sure pt gets rescheduled since she is a TOC post MI.

## 2018-07-18 NOTE — Progress Notes (Deleted)
Cardiology Office Note    Date:  07/18/2018   ID:  Erin Cantu, DOB [birth year], MRN [id number]  PCP:  Sigmund Hazel, MD  Cardiologist:  Dr. Excell Seltzer  Chief Complaint: Hospital follow up  History of Present Illness:   Erin Cantu is a 52 y.o. female ***   Past Medical History:  Diagnosis Date  . Cardiac arrest (HCC) 07/11/2018    Past Surgical History:  Procedure Laterality Date  . ABDOMINAL HYSTERECTOMY  09/2005   done for fibroids  . CORONARY BALLOON ANGIOPLASTY N/A 07/11/2018   Procedure: CORONARY BALLOON ANGIOPLASTY;  Surgeon: Tonny Bollman, MD;  Location: [facility] INVASIVE CV LAB;  Service: Cardiovascular;  Laterality: N/A;  . LEFT HEART CATH AND CORONARY ANGIOGRAPHY N/A 07/11/2018   Procedure: LEFT HEART CATH AND CORONARY ANGIOGRAPHY;  Surgeon: Tonny Bollman, MD;  Location: [facility] INVASIVE CV LAB;  Service: Cardiovascular;  Laterality: N/A;    Current Medications: Prior to Admission medications   Medication Sig Start Date End Date Taking? Authorizing Provider  aspirin EC 81 MG EC tablet Take 1 tablet (81 mg total) by mouth daily. 07/14/18   Arty Baumgartner, NP  atorvastatin (LIPITOR) 80 MG tablet Take 1 tablet (80 mg total) by mouth daily at 6 PM. 07/13/18   Arty Baumgartner, NP  metoprolol tartrate (LOPRESSOR) 25 MG tablet Take 0.5 tablets (12.5 mg total) by mouth 2 (two) times daily. 07/13/18   Arty Baumgartner, NP  nitroGLYCERIN (NITROSTAT) 0.4 MG SL tablet Place 1 tablet (0.4 mg total) under the tongue every 5 (five) minutes x 3 doses as needed for chest pain. 07/13/18   Arty Baumgartner, NP  ticagrelor (BRILINTA) 90 MG TABS tablet Take 1 tablet (90 mg total) by mouth 2 (two) times daily. 07/13/18   Arty Baumgartner, NP    Allergies:   Patient has no known allergies.   Social History   Socioeconomic History  . Marital status: Divorced    Spouse name: Not on file  . Number of children: Not on file  . Years of education: Not on file  . Highest education level:  Not on file  Occupational History  . Not on file  Social Needs  . Financial resource strain: Not on file  . Food insecurity:    Worry: Not on file    Inability: Not on file  . Transportation needs:    Medical: Not on file    Non-medical: Not on file  Tobacco Use  . Smoking status: Never Smoker  . Smokeless tobacco: Never Used  Substance and Sexual Activity  . Alcohol use: No    Frequency: Never  . Drug use: No  . Sexual activity: Not Currently    Birth control/protection: Post-menopausal, Surgical    Comment: hysterectomy  Lifestyle  . Physical activity:    Days per week: Not on file    Minutes per session: Not on file  . Stress: Not on file  Relationships  . Social connections:    Talks on phone: Not on file    Gets together: Not on file    Attends religious service: Not on file    Active member of club or organization: Not on file    Attends meetings of clubs or organizations: Not on file    Relationship status: Not on file  Other Topics Concern  . Not on file  Social History Narrative  . Not on file     Family History:  The patient's family history includes COPD in her  father; Cancer in her brother and paternal grandmother; Colon cancer in her mother; Diabetes in her maternal grandmother; Proteinuria in her daughter; Stroke in her mother. ***  ROS:   Please see the history of present illness.    ROS All other systems reviewed and are negative.   PHYSICAL EXAM:   VS:  There were no vitals taken for this visit.   GEN: Well nourished, well developed, in no acute distress  HEENT: normal  Neck: no JVD, carotid bruits, or masses Cardiac: ***RRR; no murmurs, rubs, or gallops,no edema  Respiratory:  clear to auscultation bilaterally, normal work of breathing GI: soft, nontender, nondistended, + BS MS: no deformity or atrophy  Skin: warm and dry, no rash Neuro:  Alert and Oriented x 3, Strength and sensation are intact Psych: euthymic mood, full affect  Wt  Readings from Last 3 Encounters:  07/11/18 224 lb 13.9 oz (102 kg)  07/04/18 225 lb (102.1 kg)  12/18/17 242 lb (109.8 kg)      Studies/Labs Reviewed:   EKG:  EKG is ordered today.  The ekg ordered today demonstrates ***  Recent Labs: 07/11/2018: ALT 104 07/12/2018: BUN 5; Creatinine, Ser 0.97; Hemoglobin 12.3; Platelets 231; Potassium 4.0; Sodium 139   Lipid Panel    Component Value Date/Time   CHOL 194 07/12/2018 0634   TRIG 59 07/12/2018 0634   HDL 57 07/12/2018 0634   CHOLHDL 3.4 07/12/2018 0634   VLDL 12 07/12/2018 0634   LDLCALC 125 (H) 07/12/2018 0634    Additional studies/ records that were reviewed today include:   Echocardiogram:  Cardiac Catheterization:     ASSESSMENT & PLAN:    1. ***    Medication Adjustments/Labs and Tests Ordered: Current medicines are reviewed at length with the patient today.  Concerns regarding medicines are outlined above.  Medication changes, Labs and Tests ordered today are listed in the Patient Instructions below. There are no Patient Instructions on file for this visit.   Lorelei Pont, [place]  07/18/2018 8:29 AM    [facility] [address] [place], [place], [place]  [address] Phone: [phone]; Fax: [phone]

## 2018-07-19 ENCOUNTER — Telehealth: Payer: Self-pay

## 2018-07-19 NOTE — Telephone Encounter (Signed)
Patient will make appointment on 10/15 at 1pm.

## 2018-07-23 ENCOUNTER — Telehealth: Payer: Self-pay | Admitting: Physician Assistant

## 2018-07-23 NOTE — Telephone Encounter (Signed)
New Message    Patient is calling to see when she will be able to drive. Please call to discuss.

## 2018-07-23 NOTE — Telephone Encounter (Signed)
She had a ST elevation MI with VF arrest.  Therefore, she should not drive for 2 weeks post discharge.  If her neck pain is not the pain she had with her myocardial infarction, she should follow up with primary care for evaluation.    Tereso Newcomer, PA-C    07/23/2018 2:25 PM

## 2018-07-23 NOTE — Telephone Encounter (Signed)
I returned pt's call who wanted to know when she could drive again after her cath that was on 07/11/18. I advised pt per Dr. Eldridge Dace d/c note she could start driving 48 hours after her cath unless otherwise advised by provider. Pt states she is doing fine. However, pt does states she has had this pain in her neck on the left side which she had even before her heart attack. I advised pt since she the pain has not resolved after her heart attack or after her cath, it may be muscular and she may want to call her PCP as well. I did advise I will d/w Tereso Newcomer, PA for further advice as to her neck pain. Pt does have an appt 10/22 with Tereso Newcomer, PA. Pt thanked me for the call.

## 2018-07-23 NOTE — Telephone Encounter (Signed)
Advised pt of recommendations per Tereso Newcomer, PA no driving x 2 weeks post d/c.  D/c was 07/13/18, pt advised should be ok to drive as after 16/10/96. Advised pt keep her appt with Bing Neighbors. PA 10/22 and f/u with PCP if her neck pain is not the same as she had before her MI. I advised pt let's have her come in and be seen to make sure everything is ok as to she had recent VF arrest and MI. Pt is agreeable to plan of care and has been scheduled to see [place]

## 2018-07-23 NOTE — Telephone Encounter (Signed)
Tried to call the pt back to go over recommendations from [place], [place], though no answer and vm not set up.

## 2018-07-23 NOTE — Telephone Encounter (Signed)
Advised pt of recommendations per Tereso Newcomer, PA no driving x 2 weeks post d/c.  D/c was 07/13/18, pt advised should be ok to drive as after 60/45/40. Advised pt keep her appt with Bing Neighbors. PA 10/22 and f/u with PCP if her neck pain is not the same as she had before her MI. I advised pt let's have her come in and be seen to make sure everything is ok as to she had recent VF arrest and MI. Pt is agreeable to plan of care and has been scheduled to see Bing Neighbors. PA 10/151/19 @ 11:45 AM. Pt thanked me for the call and our help.

## 2018-07-24 ENCOUNTER — Encounter: Payer: Self-pay | Admitting: Physician Assistant

## 2018-07-24 ENCOUNTER — Telehealth: Payer: Self-pay | Admitting: Physician Assistant

## 2018-07-24 ENCOUNTER — Encounter: Payer: Self-pay | Admitting: Family Medicine

## 2018-07-24 ENCOUNTER — Ambulatory Visit (INDEPENDENT_AMBULATORY_CARE_PROVIDER_SITE_OTHER): Payer: Self-pay | Admitting: Family Medicine

## 2018-07-24 ENCOUNTER — Ambulatory Visit (INDEPENDENT_AMBULATORY_CARE_PROVIDER_SITE_OTHER): Payer: Self-pay | Admitting: Physician Assistant

## 2018-07-24 VITALS — BP 100/62 | HR 97 | Ht 62.0 in | Wt 247.8 lb

## 2018-07-24 VITALS — BP 116/62 | HR 62 | Temp 97.9°F | Ht 62.0 in | Wt 247.0 lb

## 2018-07-24 DIAGNOSIS — Z8639 Personal history of other endocrine, nutritional and metabolic disease: Secondary | ICD-10-CM

## 2018-07-24 DIAGNOSIS — Z202 Contact with and (suspected) exposure to infections with a predominantly sexual mode of transmission: Secondary | ICD-10-CM

## 2018-07-24 DIAGNOSIS — K219 Gastro-esophageal reflux disease without esophagitis: Secondary | ICD-10-CM

## 2018-07-24 DIAGNOSIS — Z6841 Body Mass Index (BMI) 40.0 and over, adult: Secondary | ICD-10-CM

## 2018-07-24 DIAGNOSIS — I252 Old myocardial infarction: Secondary | ICD-10-CM

## 2018-07-24 DIAGNOSIS — S161XXA Strain of muscle, fascia and tendon at neck level, initial encounter: Secondary | ICD-10-CM

## 2018-07-24 DIAGNOSIS — M542 Cervicalgia: Secondary | ICD-10-CM

## 2018-07-24 DIAGNOSIS — Z131 Encounter for screening for diabetes mellitus: Secondary | ICD-10-CM

## 2018-07-24 DIAGNOSIS — Z0000 Encounter for general adult medical examination without abnormal findings: Secondary | ICD-10-CM

## 2018-07-24 DIAGNOSIS — E782 Mixed hyperlipidemia: Secondary | ICD-10-CM

## 2018-07-24 DIAGNOSIS — E663 Overweight: Secondary | ICD-10-CM

## 2018-07-24 DIAGNOSIS — Z23 Encounter for immunization: Secondary | ICD-10-CM

## 2018-07-24 DIAGNOSIS — I2119 ST elevation (STEMI) myocardial infarction involving other coronary artery of inferior wall: Secondary | ICD-10-CM

## 2018-07-24 DIAGNOSIS — E66813 Obesity, class 3: Secondary | ICD-10-CM

## 2018-07-24 DIAGNOSIS — R0602 Shortness of breath: Secondary | ICD-10-CM

## 2018-07-24 DIAGNOSIS — Z7689 Persons encountering health services in other specified circumstances: Secondary | ICD-10-CM

## 2018-07-24 DIAGNOSIS — Z09 Encounter for follow-up examination after completed treatment for conditions other than malignant neoplasm: Secondary | ICD-10-CM

## 2018-07-24 LAB — POCT URINALYSIS DIP (MANUAL ENTRY)
Bilirubin, UA: NEGATIVE
Blood, UA: NEGATIVE
Glucose, UA: NEGATIVE mg/dL
Ketones, POC UA: NEGATIVE mg/dL
Leukocytes, UA: NEGATIVE
Nitrite, UA: NEGATIVE
Protein Ur, POC: NEGATIVE mg/dL
Spec Grav, UA: 1.02 (ref 1.010–1.025)
Urobilinogen, UA: 1 E.U./dL
pH, UA: 6 (ref 5.0–8.0)

## 2018-07-24 LAB — POCT GLYCOSYLATED HEMOGLOBIN (HGB A1C): Hemoglobin A1C: 5.4 % (ref 4.0–5.6)

## 2018-07-24 MED ORDER — OMEPRAZOLE 20 MG PO CPDR: 20.0000 mg | DELAYED_RELEASE_CAPSULE | Freq: Every day | ORAL | 3 refills | Status: DC

## 2018-07-24 MED ORDER — CYCLOBENZAPRINE HCL 10 MG PO TABS: 10.0000 mg | ORAL_TABLET | Freq: Three times a day (TID) | ORAL | 2 refills | Status: DC

## 2018-07-24 NOTE — Patient Instructions (Addendum)
Cyclobenzaprine tablets What is this medicine? CYCLOBENZAPRINE (sye kloe BEN za preen) is a muscle relaxer. It is used to treat muscle pain, spasms, and stiffness. This medicine may be used for other purposes; ask your health care provider or pharmacist if you have questions. COMMON BRAND NAME(S): Fexmid, Flexeril What should I tell my health care provider before I take this medicine? They need to know if you have any of these conditions: -heart disease, irregular heartbeat, or previous heart attack -liver disease -thyroid problem -an unusual or allergic reaction to cyclobenzaprine, tricyclic antidepressants, lactose, other medicines, foods, dyes, or preservatives -pregnant or trying to get pregnant -breast-feeding How should I use this medicine? Take this medicine by mouth with a glass of water. Follow the directions on the prescription label. If this medicine upsets your stomach, take it with food or milk. Take your medicine at regular intervals. Do not take it more often than directed. Talk to your pediatrician regarding the use of this medicine in children. Special care may be needed. Overdosage: If you think you have taken too much of this medicine contact a poison control center or emergency room at once. NOTE: This medicine is only for you. Do not share this medicine with others. What if I miss a dose? If you miss a dose, take it as soon as you can. If it is almost time for your next dose, take only that dose. Do not take double or extra doses. What may interact with this medicine? Do not take this medicine with any of the following medications: -certain medicines for fungal infections like fluconazole, itraconazole, ketoconazole, posaconazole, voriconazole -cisapride -dofetilide -dronedarone -halofantrine -levomethadyl -MAOIs like Carbex, Eldepryl, Marplan, Nardil, and Parnate -narcotic medicines for cough -pimozide -thioridazine -ziprasidone This medicine may also  interact with the following medications: -alcohol -antihistamines for allergy, cough and cold -certain medicines for anxiety or sleep -certain medicines for cancer -certain medicines for depression like amitriptyline, fluoxetine, sertraline -certain medicines for infection like alfuzosin, chloroquine, clarithromycin, levofloxacin, mefloquine, pentamidine, troleandomycin -certain medicines for irregular heart beat -certain medicines for seizures like phenobarbital, primidone -contrast dyes -general anesthetics like halothane, isoflurane, methoxyflurane, propofol -local anesthetics like lidocaine, pramoxine, tetracaine -medicines that relax muscles for surgery -narcotic medicines for pain -other medicines that prolong the QT interval (cause an abnormal heart rhythm) -phenothiazines like chlorpromazine, mesoridazine, prochlorperazine This list may not describe all possible interactions. Give your health care provider a list of all the medicines, herbs, non-prescription drugs, or dietary supplements you use. Also tell them if you smoke, drink alcohol, or use illegal drugs. Some items may interact with your medicine. What should I watch for while using this medicine? Tell your doctor or health care professional if your symptoms do not start to get better or if they get worse. You may get drowsy or dizzy. Do not drive, use machinery, or do anything that needs mental alertness until you know how this medicine affects you. Do not stand or sit up quickly, especially if you are an older patient. This reduces the risk of dizzy or fainting spells. Alcohol may interfere with the effect of this medicine. Avoid alcoholic drinks. If you are taking another medicine that also causes drowsiness, you may have more side effects. Give your health care provider a list of all medicines you use. Your doctor will tell you how much medicine to take. Do not take more medicine than directed. Call emergency for help if you  have problems breathing or unusual sleepiness. Your mouth may get dry.  Chewing sugarless gum or sucking hard candy, and drinking plenty of water may help. Contact your doctor if the problem does not go away or is severe. What side effects may I notice from receiving this medicine? Side effects that you should report to your doctor or health care professional as soon as possible: -allergic reactions like skin rash, itching or hives, swelling of the face, lips, or tongue -breathing problems -chest pain -fast, irregular heartbeat -hallucinations -seizures -unusually weak or tired Side effects that usually do not require medical attention (report to your doctor or health care professional if they continue or are bothersome): -headache -nausea, vomiting This list may not describe all possible side effects. Call your doctor for medical advice about side effects. You may report side effects to FDA at 1-800-FDA-1088. Where should I keep my medicine? Keep out of the reach of children. Store at room temperature between 15 and 30 degrees C (59 and 86 degrees F). Keep container tightly closed. Throw away any unused medicine after the expiration date. NOTE: This sheet is a summary. It may not cover all possible information. If you have questions about this medicine, talk to your doctor, pharmacist, or health care provider.  2018 Elsevier/Gold Standard (2015-07-07 12:05:46)   Heart-Healthy Eating Plan Heart-healthy meal planning includes:  Limiting unhealthy fats.  Increasing healthy fats.  Making other small dietary changes.  You may need to talk with your doctor or a diet specialist (dietitian) to create an eating plan that is right for you. What types of fat should I choose?  Choose healthy fats. These include olive oil and canola oil, flaxseeds, walnuts, almonds, and seeds.  Eat more omega-3 fats. These include salmon, mackerel, sardines, tuna, flaxseed oil, and ground flaxseeds. Try to  eat fish at least twice each week.  Limit saturated fats. ? Saturated fats are often found in animal products, such as meats, butter, and cream. ? Plant sources of saturated fats include palm oil, palm kernel oil, and coconut oil.  Avoid foods with partially hydrogenated oils in them. These include stick margarine, some tub margarines, cookies, crackers, and other baked goods. These contain trans fats. What general guidelines do I need to follow?  Check food labels carefully. Identify foods with trans fats or high amounts of saturated fat.  Fill one half of your plate with vegetables and green salads. Eat 4-5 servings of vegetables per day. A serving of vegetables is: ? 1 cup of raw leafy vegetables. ?  cup of raw or cooked cut-up vegetables. ?  cup of vegetable juice.  Fill one fourth of your plate with whole grains. Look for the word "whole" as the first word in the ingredient list.  Fill one fourth of your plate with lean protein foods.  Eat 4-5 servings of fruit per day. A serving of fruit is: ? One medium whole fruit. ?  cup of dried fruit. ?  cup of fresh, frozen, or canned fruit. ?  cup of 100% fruit juice.  Eat more foods that contain soluble fiber. These include apples, broccoli, carrots, beans, peas, and barley. Try to get 20-30 g of fiber per day.  Eat more home-cooked food. Eat less restaurant, buffet, and fast food.  Limit or avoid alcohol.  Limit foods high in starch and sugar.  Avoid fried foods.  Avoid frying your food. Try baking, boiling, grilling, or broiling it instead. You can also reduce fat by: ? Removing the skin from poultry. ? Removing all visible fats from meats. ? Skimming  the fat off of stews, soups, and gravies before serving them. ? Steaming vegetables in water or broth.  Lose weight if you are overweight.  Eat 4-5 servings of nuts, legumes, and seeds per week: ? One serving of dried beans or legumes equals  cup after being  cooked. ? One serving of nuts equals 1 ounces. ? One serving of seeds equals  ounce or one tablespoon.  You may need to keep track of how much salt or sodium you eat. This is especially true if you have high blood pressure. Talk with your doctor or dietitian to get more information. What foods can I eat? Grains Breads, including [place], white, pita, wheat, raisin, rye, oatmeal, and [place]. Tortillas that are neither fried nor made with lard or trans fat. Low-fat rolls, including hotdog and hamburger buns and English muffins. Biscuits. Muffins. Waffles. Pancakes. Light popcorn. Whole-grain cereals. Flatbread. Melba toast. Pretzels. Breadsticks. Rusks. Low-fat snacks. Low-fat crackers, including oyster, saltine, matzo, graham, animal, and rye. Rice and pasta, including brown rice and pastas that are made with whole wheat. Vegetables All vegetables. Fruits All fruits, but limit coconut. Meats and Other Protein Sources Lean, well-trimmed beef, veal, pork, and lamb. Chicken and [place] without skin. All fish and shellfish. Wild duck, rabbit, pheasant, and venison. Egg whites or low-cholesterol egg substitutes. Dried beans, peas, lentils, and tofu. Seeds and most nuts. Dairy Low-fat or nonfat cheeses, including ricotta, string, and mozzarella. Skim or 1% milk that is liquid, powdered, or evaporated. Buttermilk that is made with low-fat milk. Nonfat or low-fat yogurt. Beverages Mineral water. Diet carbonated beverages. Sweets and Desserts Sherbets and fruit ices. Honey, jam, marmalade, jelly, and syrups. Meringues and gelatins. Pure sugar candy, such as hard candy, jelly beans, gumdrops, mints, marshmallows, and small amounts of dark chocolate. [employer]. Eat all sweets and desserts in moderation. Fats and Oils Nonhydrogenated (trans-free) margarines. Vegetable oils, including soybean, sesame, sunflower, olive, peanut, safflower, corn, canola, and cottonseed. Salad dressings or mayonnaise made  with a vegetable oil. Limit added fats and oils that you use for cooking, baking, salads, and as spreads. Other Cocoa powder. Coffee and tea. All seasonings and condiments. The items listed above may not be a complete list of recommended foods or beverages. Contact your dietitian for more options. What foods are not recommended? Grains Breads that are made with saturated or trans fats, oils, or whole milk. Croissants. Butter rolls. Cheese breads. Sweet rolls. Donuts. Buttered popcorn. Chow mein noodles. High-fat crackers, such as cheese or butter crackers. Meats and Other Protein Sources Fatty meats, such as hotdogs, short ribs, sausage, spareribs, bacon, rib eye roast or steak, and mutton. High-fat deli meats, such as salami and bologna. Caviar. Domestic duck and goose. Organ meats, such as kidney, liver, sweetbreads, and heart. Dairy Cream, sour cream, cream cheese, and creamed cottage cheese. Whole-milk cheeses, including blue (bleu), [address], [place], [place], [address], [place], [address], cheddar, [place], and [place]. Whole or 2% milk that is liquid, evaporated, or condensed. Whole buttermilk. Cream sauce or high-fat cheese sauce. Yogurt that is made from whole milk. Beverages Regular sodas and juice drinks with added sugar. Sweets and Desserts Frosting. Pudding. Cookies. Cakes other than angel food cake. Candy that has milk chocolate or white chocolate, hydrogenated fat, butter, coconut, or unknown ingredients. Buttered syrups. Full-fat ice cream or ice cream drinks. Fats and Oils Gravy that has suet, meat fat, or shortening. Cocoa butter, hydrogenated oils, palm oil, coconut oil, palm kernel oil. These can often be found in  baked products, candy, fried foods, nondairy creamers, and whipped toppings. Solid fats and shortenings, including bacon fat, salt pork, lard, and butter. Nondairy cream substitutes, such as coffee creamers and sour cream substitutes. Salad dressings that are made of  unknown oils, cheese, or sour cream. The items listed above may not be a complete list of foods and beverages to avoid. Contact your dietitian for more information. This information is not intended to replace advice given to you by your health care provider. Make sure you discuss any questions you have with your health care provider. Document Released: 03/27/2012 Document Revised: 03/03/2016 Document Reviewed: 03/20/2014 Elsevier Interactive Patient Education  2018 [employer]. DASH Eating Plan DASH stands for "Dietary Approaches to Stop Hypertension." The DASH eating plan is a healthy eating plan that has been shown to reduce high blood pressure (hypertension). It may also reduce your risk for type 2 diabetes, heart disease, and stroke. The DASH eating plan may also help with weight loss. What are tips for following this plan? General guidelines  Avoid eating more than 2,300 mg (milligrams) of salt (sodium) a day. If you have hypertension, you may need to reduce your sodium intake to 1,500 mg a day.  Limit alcohol intake to no more than 1 drink a day for nonpregnant women and 2 drinks a day for men. One drink equals 12 oz of beer, 5 oz of wine, or 1 oz of hard liquor.  Work with your health care provider to maintain a healthy body weight or to lose weight. Ask what an ideal weight is for you.  Get at least 30 minutes of exercise that causes your heart to beat faster (aerobic exercise) most days of the week. Activities may include walking, swimming, or biking.  Work with your health care provider or diet and nutrition specialist (dietitian) to adjust your eating plan to your individual calorie needs. Reading food labels  Check food labels for the amount of sodium per serving. Choose foods with less than 5 percent of the Daily Value of sodium. Generally, foods with less than 300 mg of sodium per serving fit into this eating plan.  To find whole grains, look for the word "whole" as the first  word in the ingredient list. Shopping  Buy products labeled as "low-sodium" or "no salt added."  Buy fresh foods. Avoid canned foods and premade or frozen meals. Cooking  Avoid adding salt when cooking. Use salt-free seasonings or herbs instead of table salt or sea salt. Check with your health care provider or pharmacist before using salt substitutes.  Do not fry foods. Cook foods using healthy methods such as baking, boiling, grilling, and broiling instead.  Cook with heart-healthy oils, such as olive, canola, soybean, or sunflower oil. Meal planning   Eat a balanced diet that includes: ? 5 or more servings of fruits and vegetables each day. At each meal, try to fill half of your plate with fruits and vegetables. ? Up to 6-8 servings of whole grains each day. ? Less than 6 oz of lean meat, poultry, or fish each day. A 3-oz serving of meat is about the same size as a deck of cards. One egg equals 1 oz. ? 2 servings of low-fat dairy each day. ? A serving of nuts, seeds, or beans 5 times each week. ? Heart-healthy fats. Healthy fats called Omega-3 fatty acids are found in foods such as flaxseeds and coldwater fish, like sardines, salmon, and mackerel.  Limit how much you eat of the  following: ? Canned or prepackaged foods. ? Food that is high in trans fat, such as fried foods. ? Food that is high in saturated fat, such as fatty meat. ? Sweets, desserts, sugary drinks, and other foods with added sugar. ? Full-fat dairy products.  Do not salt foods before eating.  Try to eat at least 2 vegetarian meals each week.  Eat more home-cooked food and less restaurant, buffet, and fast food.  When eating at a restaurant, ask that your food be prepared with less salt or no salt, if possible. What foods are recommended? The items listed may not be a complete list. Talk with your dietitian about what dietary choices are best for you. Grains Whole-grain or whole-wheat bread. Whole-grain or  whole-wheat pasta. Brown rice. Orpah Eldredge. Bulgur. Whole-grain and low-sodium cereals. Pita bread. Low-fat, low-sodium crackers. Whole-wheat flour tortillas. Vegetables Fresh or frozen vegetables (raw, steamed, roasted, or grilled). Low-sodium or reduced-sodium tomato and vegetable juice. Low-sodium or reduced-sodium tomato sauce and tomato paste. Low-sodium or reduced-sodium canned vegetables. Fruits All fresh, dried, or frozen fruit. Canned fruit in natural juice (without added sugar). Meat and other protein foods Skinless chicken or [place]. Ground chicken or [place]. Pork with fat trimmed off. Fish and seafood. Egg whites. Dried beans, peas, or lentils. Unsalted nuts, nut butters, and seeds. Unsalted canned beans. Lean cuts of beef with fat trimmed off. Low-sodium, lean deli meat. Dairy Low-fat (1%) or fat-free (skim) milk. Fat-free, low-fat, or reduced-fat cheeses. Nonfat, low-sodium ricotta or cottage cheese. Low-fat or nonfat yogurt. Low-fat, low-sodium cheese. Fats and oils Soft margarine without trans fats. Vegetable oil. Low-fat, reduced-fat, or light mayonnaise and salad dressings (reduced-sodium). Canola, safflower, olive, soybean, and sunflower oils. Avocado. Seasoning and other foods Herbs. Spices. Seasoning mixes without salt. Unsalted popcorn and pretzels. Fat-free sweets. What foods are not recommended? The items listed may not be a complete list. Talk with your dietitian about what dietary choices are best for you. Grains Baked goods made with fat, such as croissants, muffins, or some breads. Dry pasta or rice meal packs. Vegetables Creamed or fried vegetables. Vegetables in a cheese sauce. Regular canned vegetables (not low-sodium or reduced-sodium). Regular canned tomato sauce and paste (not low-sodium or reduced-sodium). Regular tomato and vegetable juice (not low-sodium or reduced-sodium). Rosita Fire. Olives. Fruits Canned fruit in a light or heavy syrup. Fried fruit. Fruit  in cream or butter sauce. Meat and other protein foods Fatty cuts of meat. Ribs. Fried meat. Tomasa Blase. Sausage. Bologna and other processed lunch meats. Salami. Fatback. Hotdogs. Bratwurst. Salted nuts and seeds. Canned beans with added salt. Canned or smoked fish. Whole eggs or egg yolks. Chicken or [place] with skin. Dairy Whole or 2% milk, cream, and half-and-half. Whole or full-fat cream cheese. Whole-fat or sweetened yogurt. Full-fat cheese. Nondairy creamers. Whipped toppings. Processed cheese and cheese spreads. Fats and oils Butter. Stick margarine. Lard. Shortening. Ghee. Bacon fat. Tropical oils, such as coconut, palm kernel, or palm oil. Seasoning and other foods Salted popcorn and pretzels. Onion salt, garlic salt, seasoned salt, table salt, and sea salt. Worcestershire sauce. Tartar sauce. Barbecue sauce. Teriyaki sauce. Soy sauce, including reduced-sodium. Steak sauce. Canned and packaged gravies. Fish sauce. Oyster sauce. Cocktail sauce. Horseradish that you find on the shelf. Ketchup. Mustard. Meat flavorings and tenderizers. Bouillon cubes. Hot sauce and Tabasco sauce. Premade or packaged marinades. Premade or packaged taco seasonings. Relishes. Regular salad dressings. Where to find more information:  National Heart, Lung, and Blood Institute: PopSteam.is  American Heart Association: www.heart.org  Summary  The DASH eating plan is a healthy eating plan that has been shown to reduce high blood pressure (hypertension). It may also reduce your risk for type 2 diabetes, heart disease, and stroke.  With the DASH eating plan, you should limit salt (sodium) intake to 2,300 mg a day. If you have hypertension, you may need to reduce your sodium intake to 1,500 mg a day.  When on the DASH eating plan, aim to eat more fresh fruits and vegetables, whole grains, lean proteins, low-fat dairy, and heart-healthy fats.  Work with your health care provider or diet and nutrition specialist  (dietitian) to adjust your eating plan to your individual calorie needs. This information is not intended to replace advice given to you by your health care provider. Make sure you discuss any questions you have with your health care provider. Document Released: 09/15/2011 Document Revised: 09/19/2016 Document Reviewed: 09/19/2016 Elsevier Interactive Patient Education  2018 Else[employer] Inc[employer]   Omeprazole capsules (sprinkle caps) - Rx What is this medicine? OMEPRAZOLE (oh ME pray zol) prevents the production of acid in the stomach. It is used to treat gastroesophageal reflux disease (GERD), ulcers, certain bacteria in the stomach, inflammation of the esophagus, and Zollinger-Ellison Syndrome. It is also used to treat other conditions that cause too much stomach acid. This medicine may be used for other purposes; ask your health care provider or pharmacist if you have questions. COMMON BRAND NAME(S): Prilosec What should I tell my health care provider before I take this medicine? They need to know if you have any of these conditions: -liver disease -low levels of magnesium in the blood -lupus -an unusual or allergic reaction to omeprazole, other medicines, foods, dyes, or preservatives -pregnant or trying to get pregnant -breast-feeding How should I use this medicine? Take this medicine by mouth with a glass of water. Follow the directions on the prescription label. Do not crush, break or chew the capsules. They can be opened and the contents sprinkled on a small amount of applesauce or yogurt, given with fruit juices, or swallowed immediately with water. This medicine works best if taken on an empty stomach 30 to 60 minutes before breakfast. Take your doses at regular intervals. Do not take your medicine more often than directed. Talk to your pediatrician regarding the use of this medicine in children. Special care may be needed. Overdosage: If you think you have taken too much of this medicine  contact a poison control center or emergency room at once. NOTE: This medicine is only for you. Do not share this medicine with others. What if I miss a dose? If you miss a dose, take it as soon as you can. If it is almost time for your next dose, take only that dose. Do not take double or extra doses. What may interact with this medicine? Do not take this medicine with any of the following medications: -atazanavir -clopidogrel -nelfinavir This medicine may also interact with the following medications: -ampicillin -certain medicines for anxiety or sleep -certain medicines that treat or prevent blood clots like warfarin -cyclosporine -diazepam -digoxin -disulfiram -diuretics -iron salts -methotrexate -mycophenolate mofetil -phenytoin -prescription medicine for fungal or yeast infection like itraconazole, ketoconazole, voriconazole -saquinavir -tacrolimus This list may not describe all possible interactions. Give your health care provider a list of all the medicines, herbs, non-prescription drugs, or dietary supplements you use. Also tell them if you smoke, drink alcohol, or use illegal drugs. Some items may interact with your medicine. What should I watch  for while using this medicine? It can take several days before your stomach pain gets better. Check with your doctor or health care professional if your condition does not start to get better, or if it gets worse. You may need blood work done while you are taking this medicine. What side effects may I notice from receiving this medicine? Side effects that you should report to your doctor or health care professional as soon as possible: -allergic reactions like skin rash, itching or hives, swelling of the face, lips, or tongue -bone, muscle or joint pain -breathing problems -chest pain or chest tightness -dark yellow or brown urine -dizziness -fast, irregular heartbeat -feeling faint or lightheaded -fever or sore throat -muscle  spasm -palpitations -rash on cheeks or arms that gets worse in the sun -redness, blistering, peeling or loosening of the skin, including inside the mouth -seizures -tremors -unusual bleeding or bruising -unusually weak or tired -yellowing of the eyes or skin Side effects that usually do not require medical attention (report to your doctor or health care professional if they continue or are bothersome): -constipation -diarrhea -dry mouth -headache -nausea This list may not describe all possible side effects. Call your doctor for medical advice about side effects. You may report side effects to FDA at 1-800-FDA-1088. Where should I keep my medicine? Keep out of the reach of children. Store at room temperature between 15 and 30 degrees C (59 and 86 degrees F). Protect from light and moisture. Throw away any unused medicine after the expiration date. NOTE: This sheet is a summary. It may not cover all possible information. If you have questions about this medicine, talk to your doctor, pharmacist, or health care provider.  2018 Elsevier/Gold Standard (2015-10-29 12:18:47)

## 2018-07-24 NOTE — Telephone Encounter (Addendum)
New Message           Patient needs a letter stating that she was hospitalized because of a heart attack on 07/11/2018, she also need the letter to state that she was released to go back to work as well.  Patient would like to pick this up Thursday morning. Pls call when ready.

## 2018-07-24 NOTE — Telephone Encounter (Signed)
Letter in chart. Tereso Newcomer, PA-C    07/24/2018 5:19 PM

## 2018-07-24 NOTE — Telephone Encounter (Signed)
I will route message to Tereso Newcomer, PA in regards to needing letter.

## 2018-07-24 NOTE — Patient Instructions (Addendum)
Medication Instructions:  Your physician recommends that you continue on your current medications as directed. Please refer to the Current Medication list given to you today.  If you need a refill on your cardiac medications before your next appointment, please call your pharmacy.   Lab work: FASTING LIPID AND LIVER PANEL TO BE DONE IN 6 WEEKS  If you have labs (blood work) drawn today and your tests are completely normal, you will receive your results only by: Marland Kitchen MyChart Message (if you have MyChart) OR . A paper copy in the mail If you have any lab test that is abnormal or we need to change your treatment, we will call you to review the results.  Testing/Procedures: Your physician has requested that you have an echocardiogram. Echocardiography is a painless test that uses sound waves to create images of your heart. It provides your doctor with information about the size and shape of your heart and how well your heart's chambers and valves are working. This procedure takes approximately one hour. There are no restrictions for this procedure.    Follow-Up: At [facility], you and your health needs are our priority.  As part of our continuing mission to provide you with exceptional heart care, we have created designated Provider Care Teams.  These Care Teams include your primary Cardiologist (physician) and Advanced Practice Providers (APPs -  Physician Assistants and Nurse Practitioners) who all work together to provide you with the care you need, when you need it. You will need a follow up appointment in:  3 months.  Please call our office 2 months in advance to schedule this appointment.  You may see Tonny Bollman, MD or one of the following Advanced Practice Providers on your designated Care Team: Tereso Newcomer, PA-C [place], [place] . Berton Bon, NP  Any Other Special Instructions Will Be Listed Below (If Applicable). CALL IF SHORTNESS OF BREATH CONTINUES CALL IF BELCHING  CONTINUES CALL IF YOU DO NOT HEAR FROM CARDIAC REHAB IN THE NEXT 2 WEEKS

## 2018-07-24 NOTE — Telephone Encounter (Signed)
Error

## 2018-07-24 NOTE — Progress Notes (Signed)
Hospital Follow Up--Establish Care  Subjective:    Patient ID: Erin Cantu, female    DOB: Aug 26, 1966, 52 y.o.   MRN: [id number]   Chief Complaint  Patient presents with  . Establish Care  . Hospitalization Follow-up   HPI  Erin Cantu is a 52 year old female with a past medical history of HLD, Cardiac Arrest, and CAD. She is here today to establish care.   Current Status: Since her Hospital Admission for STEMI on 07/11/2018,  she is doing well with no complaints.  She was admitted from 10/2/019-07/13/2018. She had previous ED visit on 07/04/2018 for chest wall pain, was cleared of any acute symptoms, and discharged home. She has increased fatigue, but denies any other residual changes post hear attack. She will receive call for Echo-Cardiogram, and to follow up in 3 months with Dr. Excell Seltzer. She had follow up appointment with Cardiology today and was released to drive. She denies visual changes, chest pain, cough, shortness of breath, heart palpitations, and falls. She has occasionally headaches and dizziness with position changes. Denies severe headaches, confusion, seizures, double vision, and blurred vision, nausea and vomiting.   She reports that she belches often.She reports frequent constipation, which she takes Miralax for relief. Denies other GI problems such as diarrhea. She has no reports of blood in stools, dysuria and hematuria.   She denies fevers, chills, fatigue, recent infections, weight loss, and night sweats. No depression or anxiety reported. She denies pain today.   Past Medical History:  Diagnosis Date  . CAD (coronary artery disease), native coronary artery    a. 07/2018 - total occlusion of the second OM with balloon angioplasty   . Cardiac arrest (HCC) 07/11/2018  . HLD (hyperlipidemia)   . Hyperlipidemia   . Hypertension     Family History  Problem Relation Age of Onset  . Cancer Paternal Grandmother        breast  . Diabetes Maternal Grandmother   . COPD Father    . Stroke Mother   . Colon cancer Mother   . Cancer Brother        metastatic lung cancer  . Proteinuria Daughter     Social History   Socioeconomic History  . Marital status: Divorced    Spouse name: Not on file  . Number of children: Not on file  . Years of education: Not on file  . Highest education level: Not on file  Occupational History  . Not on file  Social Needs  . Financial resource strain: Not on file  . Food insecurity:    Worry: Not on file    Inability: Not on file  . Transportation needs:    Medical: Not on file    Non-medical: Not on file  Tobacco Use  . Smoking status: Never Smoker  . Smokeless tobacco: Never Used  Substance and Sexual Activity  . Alcohol use: No    Frequency: Never  . Drug use: No  . Sexual activity: Not Currently    Birth control/protection: Post-menopausal, Surgical    Comment: hysterectomy  Lifestyle  . Physical activity:    Days per week: Not on file    Minutes per session: Not on file  . Stress: Not on file  Relationships  . Social connections:    Talks on phone: Not on file    Gets together: Not on file    Attends religious service: Not on file    Active member of club or organization: Not on file  Attends meetings of clubs or organizations: Not on file    Relationship status: Not on file  . Intimate partner violence:    Fear of current or ex partner: Not on file    Emotionally abused: Not on file    Physically abused: Not on file    Forced sexual activity: Not on file  Other Topics Concern  . Not on file  Social History Narrative  . Not on file    Past Surgical History:  Procedure Laterality Date  . ABDOMINAL HYSTERECTOMY  09/2005   done for fibroids  . CORONARY BALLOON ANGIOPLASTY N/A 07/11/2018   Procedure: CORONARY BALLOON ANGIOPLASTY;  Surgeon: Tonny Bollman, MD;  Location: [facility] INVASIVE CV LAB;  Service: Cardiovascular;  Laterality: N/A;  . LEFT HEART CATH AND CORONARY ANGIOGRAPHY N/A 07/11/2018    Procedure: LEFT HEART CATH AND CORONARY ANGIOGRAPHY;  Surgeon: Tonny Bollman, MD;  [facility] CV LAB;  Service: Cardiovascular;  Laterality: N/A;    Immunization History  Administered Date(s) Administered  . Tdap 07/24/2018    Current Meds  Medication Sig  . aspirin EC 81 MG EC tablet Take 1 tablet (81 mg total) by mouth daily.  Marland Kitchen atorvastatin (LIPITOR) 80 MG tablet Take 1 tablet (80 mg total) by mouth daily at 6 PM.  . metoprolol tartrate (LOPRESSOR) 25 MG tablet Take 0.5 tablets (12.5 mg total) by mouth 2 (two) times daily.  . nitroGLYCERIN (NITROSTAT) 0.4 MG SL tablet Place 1 tablet (0.4 mg total) under the tongue every 5 (five) minutes x 3 doses as needed for chest pain.  . ticagrelor (BRILINTA) 90 MG TABS tablet Take 1 tablet (90 mg total) by mouth 2 (two) times daily.    No Known Allergies  BP 116/62 (BP Location: Left Arm, Patient Position: Sitting, Cuff Size: Large)   Pulse 62   Temp 97.9 F (36.6 C) (Oral)   Ht 5\' 2"  (1.575 m)   Wt 247 lb (112 kg)   SpO2 100%   BMI 45.18 kg/m   Review of Systems  Constitutional: Positive for fatigue (Mild).  HENT: Negative.   Respiratory: Negative.   Cardiovascular: Negative.   Gastrointestinal: Positive for abdominal distention (Obese) and constipation (frequent).  Genitourinary: Negative.   Musculoskeletal: Negative.   Skin: Negative.   Allergic/Immunologic: Negative.   Neurological: Negative.   Psychiatric/Behavioral: Negative.    Objective:   Physical Exam  Constitutional: She is oriented to person, place, and time. She appears well-developed and well-nourished.  HENT:  Head: Normocephalic and atraumatic.  Eyes: Pupils are equal, round, and reactive to light. EOM are normal.  Neck: Normal range of motion. Neck supple.  Cardiovascular: Normal rate, regular rhythm, normal heart sounds and intact distal pulses.  Pulmonary/Chest: Effort normal and breath sounds normal.  Abdominal: Soft. Bowel sounds are normal.  She exhibits distension (Obese).  Musculoskeletal:  Limited ROM in neck  Neurological: She is alert and oriented to person, place, and time.  Skin: Skin is warm.  Psychiatric: She has a normal mood and affect. Her behavior is normal. Judgment and thought content normal.  Nursing note and vitals reviewed.  Assessment & Plan:   1. Hospital discharge follow-up  2. Encounter to establish care  3. History of STEMI She is s/p: STEMI on 07/11/2018. She is doing well with no complaints.   4. Neck muscle strain, initial encounter We will initiate Rx for Flexeril today. Monitor.   5. Screening for diabetes mellitus Hgb A1c is normal at 5.4 today.  She will  continue to decrease foods/beverages high in sugars and carbs and follow Heart Healthy or DASH diet. Increase physical activity to at least 30 minutes cardio exercise daily.  - POCT glycosylated hemoglobin (Hb A1C) - POCT urinalysis dipstick  6. Need for Tdap vaccination - Tdap vaccine greater than or equal to 7yo IM  7. Excess weight - Referral to Nutrition and Diabetes Services  8. Gastroesophageal reflux disease without esophagitis We will initiate Prilosec today. We will re-assess effectiveness in 1 month. - omeprazole (PRILOSEC) 20 MG capsule; Take 1 capsule (20 mg total) by mouth daily.  Dispense: 30 capsule; Refill: 3  9. History of vitamin D deficiency - Vitamin D, 25-hydroxy  10. Healthcare maintenance - CBC with Differential - Comprehensive metabolic panel - Lipid Panel - Referral to Nutrition and Diabetes Services  11. Class 3 severe obesity due to excess calories with serious comorbidity and body mass index (BMI) of 45.0 to 49.9 in adult [facility]) Body mass index is 45.18 kg/m. Goal BMI  is <30. Encouraged efforts to reduce weight include engaging in physical activity as tolerated with goal of 150 minutes per week. Improve dietary choices and eat a meal regimen consistent with a Mediterranean or DASH diet. Reduce simple  carbohydrates. Do not skip meals and eat healthy snacks throughout the day to avoid over-eating at dinner. Set a goal weight loss that is achievable for you.  12. Follow up She will follow up in 2 months.    Meds ordered this encounter  Medications  . omeprazole (PRILOSEC) 20 MG capsule    Sig: Take 1 capsule (20 mg total) by mouth daily.    Dispense:  30 capsule    Refill:  3  . cyclobenzaprine (FLEXERIL) 10 MG tablet    Sig: Take 1 tablet (10 mg total) by mouth 3 (three) times daily.    Dispense:  30 tablet    Refill:  2    Referral Orders     Referral to Nutrition and Diabetes Services   Raliegh Ip,  MSN, FNP-C [facility] [address] [place], [place] [address] [phone]

## 2018-07-24 NOTE — Progress Notes (Signed)
Cardiology Office Note:    Date:  07/24/2018   ID:  Erin Cantu, DOB 05/19/1966, MRN [id number]  PCP:  Patient, No Pcp Per  Cardiologist:  Tonny Bollman, MD   Electrophysiologist:  None   Referring MD: Sigmund Hazel, MD   Chief Complaint  Patient presents with  . Hospitalization Follow-up    s/p MI     History of Present Illness:    Erin Cantu is a 52 y.o. female who was admitted 10/2-10/4 with an inferior STEMI associated with VF arrest.  She presented to [facility] ED initially.  She was defibrillated x 1 while there and transported emergently to [facility].   Cardiac Catheterization demonstrated an occluded OM2 which was treated with balloon angioplasty only. The vessel was too small for stenting.  She had no significant disease elsewhere and her EF was normal on LV gram.  Post PCI course was uneventful.     Erin Cantu returns for posthospitalization follow-up.  She is here alone.  She has had some left-sided neck discomfort.  This predated her myocardial infarction.  It is worse with positional changes.  She does note increased belching that has worsened since she was in the hospital.  She also notes shortness of breath with exertion.  She sleeps on an incline chronically.  She denies paroxysmal nocturnal dyspnea, weight increase or lower extremity swelling.  She denies syncope or near syncope.  Prior CV studies:   The following studies were reviewed today:  Cardiac catheterization 07/11/2018 LM normal LCx normal; OM 2 100-thrombotic RCA normal EF 55-65, inf HK PCI: POBA to the OM2 with residual 25 1. Widely patent/angiographically normal left main, RCA, and LAD 2. 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 Mild contraction abnormality of the left ventricle with preserved overall LVEF  Nuclear stress test 02/25/2004 IMPRESSION  No  convincing evidence of myocardial ischemia or infarction. The computer generated polar map suggests anterior reversibility, not seen on the images. Wall motion is normal throughout and the QGSEF measures 67 percent.   Past Medical History:  Diagnosis Date  . CAD (coronary artery disease), native coronary artery    a. 07/2018 - total occlusion of the second OM with balloon angioplasty   . Cardiac arrest (HCC) 07/11/2018  . HLD (hyperlipidemia)    Surgical Hx: The patient  has a past surgical history that includes Abdominal hysterectomy (09/2005); LEFT HEART CATH AND CORONARY ANGIOGRAPHY (N/A, 07/11/2018); and CORONARY BALLOON ANGIOPLASTY (N/A, 07/11/2018).   Current Medications: Current Meds  Medication Sig  . aspirin EC 81 MG EC tablet Take 1 tablet (81 mg total) by mouth daily.  Marland Kitchen atorvastatin (LIPITOR) 80 MG tablet Take 1 tablet (80 mg total) by mouth daily at 6 PM.  . metoprolol tartrate (LOPRESSOR) 25 MG tablet Take 0.5 tablets (12.5 mg total) by mouth 2 (two) times daily.  . nitroGLYCERIN (NITROSTAT) 0.4 MG SL tablet Place 1 tablet (0.4 mg total) under the tongue every 5 (five) minutes x 3 doses as needed for chest pain.  . ticagrelor (BRILINTA) 90 MG TABS tablet Take 1 tablet (90 mg total) by mouth 2 (two) times daily.     Allergies:   Patient has no known allergies.   Social History   Tobacco Use  . Smoking status: Never Smoker  . Smokeless tobacco: Never Used  Substance Use Topics  . Alcohol use: No  Frequency: Never  . Drug use: No     Family Hx: The patient's family history includes COPD in her father; Cancer in her brother and paternal grandmother; Colon cancer in her mother; Diabetes in her maternal grandmother; Proteinuria in her daughter; Stroke in her mother.  ROS:   Please see the history of present illness.    Review of Systems  Hematologic/Lymphatic: Bruises/bleeds easily.   All other systems reviewed and are negative.   EKGs/Labs/Other Test Reviewed:      EKG:  EKG is  ordered today.  The ekg ordered today demonstrates sinus bradycardia, heart rate 57, normal axis, T wave inversions 2, 3, aVF, V4-V6, QTC 404, similar to prior ECG from 07/13/2018  Recent Labs: 07/11/2018: ALT 104 07/12/2018: BUN 5; Creatinine, Ser 0.97; Hemoglobin 12.3; Platelets 231; Potassium 4.0; Sodium 139   Recent Lipid Panel Lab Results  Component Value Date/Time   CHOL 194 07/12/2018 06:34 AM   TRIG 59 07/12/2018 06:34 AM   HDL 57 07/12/2018 06:34 AM   CHOLHDL 3.4 07/12/2018 06:34 AM   LDLCALC 125 (H) 07/12/2018 06:34 AM    Physical Exam:    VS:  BP 100/62   Pulse 97   Ht 5\' 2"  (1.575 m)   Wt 247 lb 12.8 oz (112.4 kg)   SpO2 98%   BMI 45.32 kg/m     Wt Readings from Last 3 Encounters:  07/24/18 247 lb 12.8 oz (112.4 kg)  07/11/18 224 lb 13.9 oz (102 kg)  07/04/18 225 lb (102.1 kg)     Physical Exam  Constitutional: She is oriented to person, place, and time. She appears well-developed and well-nourished. No distress.  HENT:  Head: Normocephalic and atraumatic.  Eyes: No scleral icterus.  Neck: No JVD present. No thyromegaly present.  Cardiovascular: Normal rate and regular rhythm.  No murmur heard. Pulmonary/Chest: Effort normal. She has no rales.  Abdominal: Soft.  Musculoskeletal: She exhibits no edema.  R wrist without hematoma  Lymphadenopathy:    She has no cervical adenopathy.  Neurological: She is alert and oriented to person, place, and time.  Skin: Skin is warm and dry.  Psychiatric: She has a normal mood and affect.    ASSESSMENT & PLAN:    Acute ST elevation myocardial infarction (STEMI) of inferior wall (HCC) Status post recent inferior ST elevation myocardial infarction complicated by ventricular fibrillation arrest.  She was treated with balloon angioplasty only to an occluded OM2.  The vessel was too small for stenting.  She has not had any recurrent angina.  She has been somewhat short of breath.  Question if this is related  to the Brilinta.  She does not display any signs of volume excess on exam.  Her ejection fraction was normal at cardiac catheterization.  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  She may return to work now and she also may return to driving.  -Continue aspirin, ticagrelor, atorvastatin, metoprolol  -Blood pressure too low to add ACE inhibitor  -I encouraged her to pursue cardiac rehab  Mixed hyperlipidemia She has noted some increased belching.  This may be from atorvastatin.  Continue atorvastatin for now.  Plan lipids and LFTs in 6 weeks.  If belching continues, consider changing atorvastatin to rosuvastatin.  Shortness of breath She has noted some shortness of breath with exertion.  No signs of volume excess on exam.  Question if her symptoms are related to Ticagrelor.  Her ejection fraction was normal on LV gram at  cardiac catheterization.  -Arrange echo to obtain formal EF  -If shortness of breath continues, consider changing Ticagrelor to Clopidogrel  Neck pain This seems to be musculoskeletal.  She has follow up with primary care later today.   Dispo:  Return in about 3 months (around 10/24/2018) for Routine Follow Up, w/ Tereso Newcomer, PA-C, or Tereso Newcomer, PA-C.   Medication Adjustments/Labs and Tests Ordered: Current medicines are reviewed at length with the patient today.  Concerns regarding medicines are outlined above.  Tests Ordered: Orders Placed This Encounter  Procedures  . Lipid Profile  . Hepatic function panel  . EKG 12-Lead  . ECHOCARDIOGRAM COMPLETE   Medication Changes: No orders of the defined types were placed in this encounter.   Signed, Tereso Newcomer, PA-C  07/24/2018 12:45 PM    [facility] [address] [place], [place], [place]  [address] Phone: [phone]; Fax: [phone]

## 2018-07-25 LAB — CBC WITH DIFFERENTIAL/PLATELET
Basophils Absolute: 0.1 10*3/uL (ref 0.0–0.2)
Basos: 1 %
EOS (ABSOLUTE): 0.1 10*3/uL (ref 0.0–0.4)
Eos: 2 %
Hematocrit: 42.2 % (ref 34.0–46.6)
Hemoglobin: 13.7 g/dL (ref 11.1–15.9)
Immature Grans (Abs): 0 10*3/uL (ref 0.0–0.1)
Immature Granulocytes: 0 %
Lymphocytes Absolute: 1.6 10*3/uL (ref 0.7–3.1)
Lymphs: 29 %
MCH: 28.8 pg (ref 26.6–33.0)
MCHC: 32.5 g/dL (ref 31.5–35.7)
MCV: 89 fL (ref 79–97)
Monocytes Absolute: 0.4 10*3/uL (ref 0.1–0.9)
Monocytes: 8 %
Neutrophils Absolute: 3.5 10*3/uL (ref 1.4–7.0)
Neutrophils: 60 %
Platelets: 311 10*3/uL (ref 150–450)
RBC: 4.76 x10E6/uL (ref 3.77–5.28)
RDW: 12.8 % (ref 12.3–15.4)
WBC: 5.7 10*3/uL (ref 3.4–10.8)

## 2018-07-25 LAB — COMPREHENSIVE METABOLIC PANEL
ALT: 17 IU/L (ref 0–32)
AST: 13 IU/L (ref 0–40)
Albumin/Globulin Ratio: 1.9 (ref 1.2–2.2)
Albumin: 4.6 g/dL (ref 3.5–5.5)
Alkaline Phosphatase: 85 IU/L (ref 39–117)
BUN/Creatinine Ratio: 10 (ref 9–23)
BUN: 9 mg/dL (ref 6–24)
Bilirubin Total: 0.6 mg/dL (ref 0.0–1.2)
CO2: 25 mmol/L (ref 20–29)
Calcium: 9.5 mg/dL (ref 8.7–10.2)
Chloride: 101 mmol/L (ref 96–106)
Creatinine, Ser: 0.91 mg/dL (ref 0.57–1.00)
GFR calc Af Amer: 85 mL/min/{1.73_m2} (ref >59)
GFR calc non Af Amer: 74 mL/min/{1.73_m2} (ref >59)
Globulin, Total: 2.4 g/dL (ref 1.5–4.5)
Glucose: 111 mg/dL — ABNORMAL HIGH (ref 65–99)
Potassium: 4.1 mmol/L (ref 3.5–5.2)
Sodium: 142 mmol/L (ref 134–144)
Total Protein: 7 g/dL (ref 6.0–8.5)

## 2018-07-25 LAB — LIPID PANEL
Chol/HDL Ratio: 3 ratio (ref 0.0–4.4)
Cholesterol, Total: 127 mg/dL (ref 100–199)
HDL: 43 mg/dL (ref >39)
LDL Calculated: 72 mg/dL (ref 0–99)
Triglycerides: 62 mg/dL (ref 0–149)
VLDL Cholesterol Cal: 12 mg/dL (ref 5–40)

## 2018-07-25 LAB — VITAMIN D 25 HYDROXY (VIT D DEFICIENCY, FRACTURES): Vit D, 25-Hydroxy: 26.3 ng/mL — ABNORMAL LOW (ref 30.0–100.0)

## 2018-07-25 NOTE — Telephone Encounter (Signed)
I s/w pt today and advised letter for her to pick up will be at the front desk. Pt thanked me for the call.

## 2018-07-26 ENCOUNTER — Encounter: Payer: Self-pay | Admitting: Registered"

## 2018-07-26 ENCOUNTER — Encounter: Payer: Self-pay | Attending: Family Medicine | Admitting: Registered"

## 2018-07-26 DIAGNOSIS — E663 Overweight: Secondary | ICD-10-CM | POA: Insufficient documentation

## 2018-07-26 DIAGNOSIS — Z0000 Encounter for general adult medical examination without abnormal findings: Secondary | ICD-10-CM | POA: Insufficient documentation

## 2018-07-26 DIAGNOSIS — Z713 Dietary counseling and surveillance: Secondary | ICD-10-CM | POA: Insufficient documentation

## 2018-07-26 NOTE — Progress Notes (Signed)
Medical Nutrition Therapy:  Appt start time: 0930 end time:  5852.  Assessment:  Primary concerns today: Pt states she would like more energy, avoid T2DM and basically be healthy.   Relevant labs: Hgb A1c is normal at 5.4 today, vit D 26.3 BP 116/80  Pt states she is s/p heart attach 07/11/18 and noticed more heart burn before heart attack and patient continues to have issues with belching/flatulence, often worse with dairy. Pt reports 2% milk has less effect than whole milk, a lot of gas in stomach when first gets up in the morning.  A lot of constipation before heart attack, if she goes 2 days without BM will take Miralax, somewhat better than in the past, may go a day without BM. Pt states she is uncomfortable, hears stomach rolling feels like she needs to have BM but it doesn't happen.  Pt reports recent episode of upset stomach all night, nausea, felt like she had to vomit and loose stool during night. Food intake the day before episode: B:  raisin bran, grapes L: salad D: [place] grilled chicken, baked potato slaw, [place] toast.   Pt states she had one episode while eating brown fried rice hurt, burning going down esophagus - felt like scratching.  Pt reports she was sleeping a lot and had to take naps right before heart attack. Patient states she was active in school, sports, wants to be active again. No history issues with period After had daughter in 69, miscarriage 2001, had shots but didn't have d&c. Pt reports menstural cycle had always been very light until right before hysterectomy was very heavy then tumor was removed.   Stress: 8-9/10 . Tries to deal with it. laid off [employer] Sept 10, relationship 1 1/2 yrs Was going to a Social worker, on [address]. Pt reports 1 year ago moved in with brother added to stress. Pt states she has issue with non-hunger eating.  Sleep: 9:30 pm wakes up 2-3 am, sometimes stays awake until 6 am  Pt states she is taking medication due to muscle  spasm in neck has been x1 month, thinks it may be due to how she sleeps, doesn't like to use pillows.  Patient wants more information on smoothies RD can cover next visit.  Preferred Learning Style:   No preference indicated   Learning Readiness:   Ready  MEDICATIONS: reviewed   DIETARY INTAKE:  24-hr recall:  B ( AM): OR cottage, pineapple OR eggs, [place] bacon OR breakfast burrito, bacon [employer], black coffee or hot tea,  Snk ( AM): when trying to eat right will have snack - atkins bar, apple  L ( PM): salad OR hamburgers hotdogs OR [place] wrap, potatoes chips Snk ( PM): chips, nabs D (6 -8 PM): none OR hotdogs, hamburger, or salad OR steak, potatoes, string beans, mac & cheese Snk ( PM): nuts Beverages: water, lipton green tea, black coffee  Usual physical activity: ADLs Pt states she is walking around home to work up stamina.  Estimated energy needs: 1600 calories  Progress Towards Goal(s):  New goals.   Nutritional Diagnosis:  NB-1.1 Food and nutrition-related knowledge deficit As related to understanding of different components of foods that can cause issue.  As evidenced by lack of understanding how dairy lactose and fat play different roles in unwanted effects..    Intervention:  Nutrition Education. Discussed the role of stress and sleep in health and what we choose to eat. Discussed options to help constipation.  Plan: Follow-up with  who ever told you that your iron was in issue, [place] [facility]?) to see if you should look into it further. Consider taking Calm magnesium supplement before bedtime for cramps, constipation. Dairy products that are lactose free may help with the stomach issues. Consider the sleep hygiene tips for better quality sleep Look into sleep apnea to see if you think you could benefit from a referral to the Sleep Clinic. Look into getting a new counselor  Teaching Method Utilized:  Visual Auditory  Handouts given during  visit include:  Sleep hygiene  Sleep clinic brochure  Barriers to learning/adherence to lifestyle change: none  Demonstrated degree of understanding via:  Teach Back   Monitoring/Evaluation:  Dietary intake, exercise, and body weight in 6 week(s).

## 2018-07-26 NOTE — Patient Instructions (Addendum)
Follow-up with who ever told you that your iron was in issue, [facility] family practice?) to see if you should look into it further. Consider taking Calm magnesium supplement before bedtime for cramps, constipation. Dairy products that are lactose free may help with the stomach issues. Consider the sleep hygiene tips for better quality sleep Look into sleep apnea to see if you think you could benefit from a referral to the Sleep Clinic. Look into getting a new counselor

## 2018-07-27 ENCOUNTER — Ambulatory Visit: Payer: 59 | Admitting: Cardiovascular Disease

## 2018-07-27 LAB — NUSWAB VAGINITIS PLUS (VG+)
Candida albicans, NAA: NEGATIVE
Candida glabrata, NAA: NEGATIVE
Chlamydia trachomatis, NAA: NEGATIVE
Neisseria gonorrhoeae, NAA: NEGATIVE
Trich vag by NAA: NEGATIVE

## 2018-07-30 ENCOUNTER — Telehealth: Payer: Self-pay

## 2018-07-31 ENCOUNTER — Ambulatory Visit: Payer: Self-pay | Admitting: Physician Assistant

## 2018-07-31 NOTE — Telephone Encounter (Signed)
Please inform patient that all labs are stable. Swab results are negative. Her Vitamin D level is mildly decreased, so we can advise her to take daily OTC Vitamin D supplement. She is to continue to take all medications as prescribed and to keep follow up appointment with [place] on 09/24/2018.   Thank you.

## 2018-08-01 NOTE — Telephone Encounter (Signed)
Patient notified

## 2018-08-02 ENCOUNTER — Other Ambulatory Visit: Payer: Self-pay

## 2018-08-02 ENCOUNTER — Ambulatory Visit (HOSPITAL_COMMUNITY): Payer: Self-pay | Attending: Internal Medicine

## 2018-08-02 DIAGNOSIS — R0602 Shortness of breath: Secondary | ICD-10-CM | POA: Insufficient documentation

## 2018-08-03 ENCOUNTER — Telehealth: Payer: Self-pay | Admitting: *Deleted

## 2018-08-03 ENCOUNTER — Encounter: Payer: Self-pay | Admitting: Physician Assistant

## 2018-08-03 DIAGNOSIS — I2119 ST elevation (STEMI) myocardial infarction involving other coronary artery of inferior wall: Secondary | ICD-10-CM

## 2018-08-03 DIAGNOSIS — R0602 Shortness of breath: Secondary | ICD-10-CM

## 2018-08-03 NOTE — Telephone Encounter (Signed)
-----   Message from Beatrice Lecher, [place] sent at 08/03/2018  1:56 PM EDT ----- The echocardiogram demonstrates normal heart function (ejection fraction), wall motion changes consistent with previous heart attack, mild to moderate leakage of the mitral valve. Recommendations:  - Continue current medications and follow up as planned.   - Send copy to PCP  - Arrange Echo in 1 year.  Tereso Newcomer, PA-C    08/03/2018 1:54 PM

## 2018-08-03 NOTE — Telephone Encounter (Signed)
New Message:   Patient calling about some  Results. Please call patient.

## 2018-08-03 NOTE — Telephone Encounter (Signed)
Pt has been notified of echo results by phone with verbal understanding. Pt states to me she cannot afford her medications, that she has been denied unemployment. Pt is very stressed out about her finances and is not sure how she is going to make ends meet. I asked her if she applied for any of the assistance programs that [facility] offers to help. She said she has the paperwork and they are asking for her W2 and other finance information. I asked the pt if she applied for the assistance prgm for Brilinta. Pt said yes but was denied. Pt states her PCP is going to refer her to [facility] and her Rx's will be much cheaper for her. I did advise pt that she cannot run out of the Brilinta, Advised if she is not set up by next month with the Clinic to call our office to see if we have samples of Brilinta. Pt thanked me for my call and listening to her.

## 2018-08-03 NOTE — Telephone Encounter (Signed)
Please see echo results. Tereso Newcomer, PA-C    08/03/2018 1:59 PM

## 2018-08-03 NOTE — Telephone Encounter (Signed)
Pt called requesting her Echo results.. Advised her that we will notify her as soon as the Physician reviews it and sends [place] his report. Pt verbalized understanding.

## 2018-08-06 ENCOUNTER — Telehealth: Payer: Self-pay

## 2018-08-06 MED ORDER — ATORVASTATIN CALCIUM 80 MG PO TABS: 80.0000 mg | ORAL_TABLET | Freq: Every day | ORAL | 1 refills | Status: DC

## 2018-08-06 MED ORDER — TICAGRELOR 90 MG PO TABS: 90.0000 mg | ORAL_TABLET | Freq: Two times a day (BID) | ORAL | 11 refills | Status: DC

## 2018-08-06 MED ORDER — METOPROLOL TARTRATE 25 MG PO TABS: 12.5000 mg | ORAL_TABLET | Freq: Two times a day (BID) | ORAL | 1 refills | Status: DC

## 2018-08-06 MED FILL — BRILINTA 90 MG TABS: 90 | 30 days supply | Qty: 60 | Fill #0

## 2018-08-06 MED FILL — METOPROLOL TARTRATE 25 MG T: 25 | 30 days supply | Qty: 60 | Fill #0

## 2018-08-06 MED FILL — ATORVASTATIN 80 MG TABLET: 80 | 30 days supply | Qty: 30 | Fill #0

## 2018-08-06 NOTE — Telephone Encounter (Signed)
[facility] 

## 2018-08-27 ENCOUNTER — Ambulatory Visit (INDEPENDENT_AMBULATORY_CARE_PROVIDER_SITE_OTHER): Payer: Self-pay | Admitting: Family Medicine

## 2018-08-27 ENCOUNTER — Encounter: Payer: Self-pay | Admitting: Family Medicine

## 2018-08-27 VITALS — BP 114/60 | HR 74 | Temp 98.0°F | Ht 62.0 in | Wt 251.2 lb

## 2018-08-27 DIAGNOSIS — Z202 Contact with and (suspected) exposure to infections with a predominantly sexual mode of transmission: Secondary | ICD-10-CM

## 2018-08-27 DIAGNOSIS — Z113 Encounter for screening for infections with a predominantly sexual mode of transmission: Secondary | ICD-10-CM

## 2018-08-27 DIAGNOSIS — Z09 Encounter for follow-up examination after completed treatment for conditions other than malignant neoplasm: Secondary | ICD-10-CM

## 2018-08-27 DIAGNOSIS — K219 Gastro-esophageal reflux disease without esophagitis: Secondary | ICD-10-CM

## 2018-08-27 DIAGNOSIS — I252 Old myocardial infarction: Secondary | ICD-10-CM

## 2018-08-27 NOTE — Progress Notes (Signed)
Sick Visit  Subjective:    Patient ID: Erin AmatoSandra Plotkin, female    DOB: 10-13-1965, 52 y.o.   MRN: [id number]   Chief Complaint  Patient presents with  . STI Testing    HPI Ms. Schuld has a [age] year old female with a past medical history of Hypertension, Hyperlipidemia, HLD, Cardiac Arrest, and CAD. She is here today for a sick visit.   Current Status: Since her last office visit, she is doing well with no complaints. She states that she continues to have symptoms of STDs. She was previously tested at last office visit. Her anxiety level is increased today, r/t family dynamics and worry about possible exposure to STDS. She has not taken Prilosec in a few days, and has been having chest discomfort since then. No other reports of any GI problems such as nausea, vomiting, diarrhea, and constipation. She has no reports of blood in stools, dysuria and hematuria.No chest pain, heart palpitations, cough and shortness of breath reported.   She denies fevers, chills, fatigue, recent infections, weight loss, and night sweats. She has not had any headaches, visual changes, dizziness, and falls.  No depression or anxiety, and denies suicidal ideations, homicidal ideations, or auditory hallucinations. She denies pain today.   Review of Systems  Constitutional: Negative.   HENT: Negative.   Respiratory: Positive for shortness of breath.   Cardiovascular: Negative.   Gastrointestinal: Negative.   Endocrine: Negative.   Genitourinary: Negative.   Musculoskeletal: Negative.   Skin: Negative.   Allergic/Immunologic: Negative.   Neurological: Positive for seizures.  Psychiatric/Behavioral: Negative.    Objective:   Physical Exam  Constitutional: She is oriented to person, place, and time. She appears well-developed and well-nourished.  HENT:  Head: Normocephalic and atraumatic.  Eyes: Pupils are equal, round, and reactive to light. Conjunctivae and EOM are normal.  Neck: Normal range of motion. Neck  supple.  Cardiovascular: Normal rate, regular rhythm, normal heart sounds and intact distal pulses.  Pulmonary/Chest: Effort normal and breath sounds normal.  Abdominal: Soft. Bowel sounds are normal.  Musculoskeletal: Normal range of motion.  Neurological: She is alert and oriented to person, place, and time.  Skin: Skin is warm and dry.  Psychiatric: Her behavior is normal. Judgment and thought content normal.  Increased anxiety.   Nursing note and vitals reviewed.  Assessment & Plan:   1. Possible exposure to STD Results are pending.  - STD Screen (8)  2. Screening for STDs (sexually transmitted diseases) Swab results are pending.  - NuSwab Vaginitis Plus (VG+)  3. History of ST elevation myocardial infarction (STEMI) Continue Brilinta and Nitrostat as prescribed. Monitor.   4. Gastroesophageal reflux disease without esophagitis Continue Omeprazole as prescribed.   5. Follow up She will follow up in 3 months.   No orders of the defined types were placed in this encounter.  Raliegh IpNatalie Aarish Rockers,  MSN, FNP-C [facility] [address]  [place], [place] [id number] [phone]

## 2018-08-28 LAB — STD SCREEN (8)
HIV Screen 4th Generation wRfx: NONREACTIVE
HSV 1 Glycoprotein G Ab, IgG: 42.6 index — ABNORMAL HIGH (ref 0.00–0.90)
HSV 2 IgG, Type Spec: >23.6 index — ABNORMAL HIGH (ref 0.00–0.90)
Hep A IgM: NEGATIVE
Hep B C IgM: NEGATIVE
Hep C Virus Ab: <0.1 s/co ratio (ref 0.0–0.9)
Hepatitis B Surface Ag: NEGATIVE
RPR Ser Ql: NONREACTIVE

## 2018-08-31 LAB — NUSWAB VAGINITIS PLUS (VG+)
Candida albicans, NAA: NEGATIVE
Candida glabrata, NAA: NEGATIVE
Chlamydia trachomatis, NAA: NEGATIVE
Neisseria gonorrhoeae, NAA: NEGATIVE

## 2018-09-03 ENCOUNTER — Other Ambulatory Visit: Payer: Self-pay | Admitting: *Deleted

## 2018-09-03 DIAGNOSIS — E782 Mixed hyperlipidemia: Secondary | ICD-10-CM

## 2018-09-03 LAB — HEPATIC FUNCTION PANEL
ALK PHOS: 115 IU/L (ref 39–117)
ALT: 23 IU/L (ref 0–32)
AST: 16 IU/L (ref 0–40)
Albumin: 4.1 g/dL (ref 3.5–5.5)
BILIRUBIN TOTAL: 0.5 mg/dL (ref 0.0–1.2)
Bilirubin, Direct: 0.14 mg/dL (ref 0.00–0.40)
Total Protein: 6.4 g/dL (ref 6.0–8.5)

## 2018-09-04 ENCOUNTER — Other Ambulatory Visit: Payer: Self-pay | Admitting: Family Medicine

## 2018-09-04 ENCOUNTER — Telehealth: Payer: Self-pay

## 2018-09-04 DIAGNOSIS — K219 Gastro-esophageal reflux disease without esophagitis: Secondary | ICD-10-CM

## 2018-09-04 DIAGNOSIS — E782 Mixed hyperlipidemia: Secondary | ICD-10-CM

## 2018-09-04 DIAGNOSIS — B009 Herpesviral infection, unspecified: Secondary | ICD-10-CM

## 2018-09-04 MED ORDER — VALACYCLOVIR HCL 500 MG PO TABS: 500.0000 mg | ORAL_TABLET | Freq: Every day | ORAL | 3 refills | Status: DC

## 2018-09-04 MED ORDER — OMEPRAZOLE 20 MG PO CPDR: 20.0000 mg | DELAYED_RELEASE_CAPSULE | Freq: Every day | ORAL | 3 refills | Status: DC

## 2018-09-04 MED FILL — VALACYCLOVIR HCL 500 MG TAB: 500 | 15 days supply | Qty: 30 | Fill #0

## 2018-09-04 MED FILL — OMEPRAZOLE 20 MG CAP: 20 | 30 days supply | Qty: 30 | Fill #0

## 2018-09-04 NOTE — Telephone Encounter (Signed)
Made pt aware of results who verbalized understanding. Per Tereso NewcomerScott Weaver, PA-C recommendation to have fasting lipid panel. An order is placed into epic. Lab app 11/27

## 2018-09-04 NOTE — Progress Notes (Signed)
New Rx for Valtrex sent to pharmacy today. Patient is advised to take Valtrex 500 mg daily for suppression of outbreaks. Patient is advised to take Valtrex 1000 mg daily X 1 week for outbreaks. Patient verbalized understanding.

## 2018-09-05 ENCOUNTER — Other Ambulatory Visit: Payer: Self-pay | Admitting: *Deleted

## 2018-09-05 DIAGNOSIS — E782 Mixed hyperlipidemia: Secondary | ICD-10-CM

## 2018-09-05 LAB — LIPID PANEL
CHOL/HDL RATIO: 2.4 ratio (ref 0.0–4.4)
Cholesterol, Total: 107 mg/dL (ref 100–199)
HDL: 45 mg/dL (ref >39)
LDL CALC: 51 mg/dL (ref 0–99)
Triglycerides: 53 mg/dL (ref 0–149)
VLDL Cholesterol Cal: 11 mg/dL (ref 5–40)

## 2018-09-10 ENCOUNTER — Telehealth: Payer: Self-pay

## 2018-09-10 ENCOUNTER — Ambulatory Visit: Payer: Self-pay | Admitting: Registered"

## 2018-09-10 MED ORDER — ATORVASTATIN CALCIUM 80 MG PO TABS: 80.0000 mg | ORAL_TABLET | Freq: Every day | ORAL | 1 refills | Status: DC

## 2018-09-10 MED ORDER — METOPROLOL TARTRATE 25 MG PO TABS: 12.5000 mg | ORAL_TABLET | Freq: Two times a day (BID) | ORAL | 1 refills | Status: DC

## 2018-09-10 MED ORDER — TICAGRELOR 90 MG PO TABS: 90.0000 mg | ORAL_TABLET | Freq: Two times a day (BID) | ORAL | 11 refills | Status: DC

## 2018-09-10 NOTE — Telephone Encounter (Signed)
Refills sent into pharmacy. Thanks!  

## 2018-09-12 MED FILL — BRILINTA 90 MG TABS: 90 | 30 days supply | Qty: 60 | Fill #1

## 2018-09-12 MED FILL — ATORVASTATIN 80 MG TABLET: 80 | 30 days supply | Qty: 30 | Fill #1

## 2018-09-12 MED FILL — METOPROLOL TARTRATE 25 MG T: 25 | 30 days supply | Qty: 60 | Fill #1

## 2018-09-24 ENCOUNTER — Ambulatory Visit: Payer: Self-pay | Admitting: Family Medicine

## 2018-10-01 MED FILL — VALACYCLOVIR HCL 500 MG TAB: 500 | 15 days supply | Qty: 30 | Fill #1

## 2018-10-01 MED FILL — OMEPRAZOLE 20 MG CAP: 20 | 30 days supply | Qty: 30 | Fill #1

## 2018-10-08 ENCOUNTER — Ambulatory Visit: Payer: Self-pay | Attending: Family Medicine

## 2018-10-08 MED FILL — ATORVASTATIN 80 MG TABLET: 80 | 30 days supply | Qty: 30 | Fill #2

## 2018-10-31 ENCOUNTER — Other Ambulatory Visit: Payer: Self-pay | Admitting: Family Medicine

## 2018-11-09 ENCOUNTER — Other Ambulatory Visit: Payer: Self-pay | Admitting: Family Medicine

## 2018-11-09 MED FILL — VALACYCLOVIR HCL 500 MG TAB: 500 | 15 days supply | Qty: 30 | Fill #2

## 2018-11-09 MED FILL — ?OMEPRAZOLE 20 MG CAPSULE D: 20 | 30 days supply | Qty: 30 | Fill #2

## 2018-11-09 MED FILL — ATORVASTATIN 80 MG TABLET: 80 | 30 days supply | Qty: 30 | Fill #3

## 2018-11-11 NOTE — Progress Notes (Signed)
Cardiology Office Note:    Date:  11/12/2018   ID:  [name], DOB Apr 03, 1966, MRN [id number]  PCP:  Azzie Glatter, FNP  Cardiologist:  Sherren Mocha, MD  Electrophysiologist:  None   Referring MD: No ref. provider found   Chief Complaint  Patient presents with  . Coronary Artery Disease    History of Present Illness:    Erin Cantu is a 53 y.o. female with a hx of CAD and anterior STEMI in October 2019. She suffered VF arrest that was witnessed and treated promptly. She was found to have an occluded OM2 that was too small for stenting and she was treated with balloon angioplasty alone. LVEF was preserved and post-PCI was uneventful. At the time of her hospital follow-up visit, she complained of shortness of breath. An echocardiogram showed akinesis of the inferolateral wall with an LVEF of 55-60%.  She is here alone today. She is complaining of difficulty with sleep. She's waking up at night and feels restless. Also having palpitations at night. She's walking for exercise without exertional chest pain or pressure. She does complain of shortness of breath with activity.  She denies edema, orthopnea, or PND.  Notes that she has gained weight. She plans to join [employer].  She has major financial stress right now related to her hospital bills.  Past Medical History:  Diagnosis Date  . CAD (coronary artery disease), native coronary artery    a. 07/2018 - total occlusion of the second OM with balloon angioplasty   . Cardiac arrest ([place]) 07/11/2018  . Echocardiogram    Echo 10/19: EF 55-60, inferolateral and inferior hypokinesis, mild to moderate mitral regurgitation, mild to moderate LAE, normal RVSF, trivial TR  . HLD (hyperlipidemia)   . Hyperlipidemia   . Hypertension     Past Surgical History:  Procedure Laterality Date  . ABDOMINAL HYSTERECTOMY  09/2005   done for fibroids  . CORONARY BALLOON ANGIOPLASTY N/A 07/11/2018   Procedure: CORONARY BALLOON ANGIOPLASTY;   Surgeon: Sherren Mocha, MD;  Location: [place] [facility];  Service: Cardiovascular;  Laterality: N/A;  . LEFT HEART CATH AND CORONARY ANGIOGRAPHY N/A 07/11/2018   Procedure: LEFT HEART CATH AND CORONARY ANGIOGRAPHY;  Surgeon: Sherren Mocha, MD;  Location: [place] [facility];  Service: Cardiovascular;  Laterality: N/A;    Current Medications: Current Meds  Medication Sig  . aspirin EC 81 MG EC tablet Take 1 tablet (81 mg total) by mouth daily.  Marland Kitchen atorvastatin (LIPITOR) 80 MG tablet Take 1 tablet (80 mg total) by mouth daily at 6 PM.  . cyclobenzaprine (FLEXERIL) 10 MG tablet Take 1 tablet (10 mg total) by mouth 3 (three) times daily.  . metoprolol tartrate (LOPRESSOR) 25 MG tablet Take 1 tablet (25 mg total) by mouth 2 (two) times daily.  . nitroGLYCERIN (NITROSTAT) 0.4 MG SL tablet Place 1 tablet (0.4 mg total) under the tongue every 5 (five) minutes x 3 doses as needed for chest pain.  Marland Kitchen omeprazole (PRILOSEC) 20 MG capsule Take 1 capsule (20 mg total) by mouth daily.  . ticagrelor (BRILINTA) 90 MG TABS tablet Take 1 tablet (90 mg total) by mouth 2 (two) times daily.  . valACYclovir (VALTREX) 500 MG tablet Take 1 tablet (500 mg total) by mouth daily. Take 2 tablets (1,000 mg total) by mouth daily X 1 week for outbreaks.  . [DISCONTINUED] metoprolol tartrate (LOPRESSOR) 25 MG tablet Take 0.5 tablets (12.5 mg total) by mouth 2 (two) times daily.  . [DISCONTINUED] metoprolol  tartrate (LOPRESSOR) 25 MG tablet Take 1 tablet (25 mg total) by mouth 2 (two) times daily.     Allergies:   Patient has no known allergies.   Social History   Socioeconomic History  . Marital status: Divorced    Spouse name: Not on file  . Number of children: Not on file  . Years of education: Not on file  . Highest education level: Not on file  Occupational History  . Not on file  Social Needs  . Financial resource strain: Not on file  . Food insecurity:    Worry: Not on file    Inability: Not on file  .  Transportation needs:    Medical: Not on file    Non-medical: Not on file  Tobacco Use  . Smoking status: Never Smoker  . Smokeless tobacco: Never Used  Substance and Sexual Activity  . Alcohol use: No    Frequency: Never  . Drug use: No  . Sexual activity: Not Currently    Birth control/protection: Post-menopausal, Surgical    Comment: hysterectomy  Lifestyle  . Physical activity:    Days per week: Not on file    Minutes per session: Not on file  . Stress: Not on file  Relationships  . Social connections:    Talks on phone: Not on file    Gets together: Not on file    Attends religious service: Not on file    Active member of club or organization: Not on file    Attends meetings of clubs or organizations: Not on file    Relationship status: Not on file  Other Topics Concern  . Not on file  Social History Narrative  . Not on file     Family History: The patient's family history includes COPD in her father; Cancer in her brother and paternal grandmother; Colon cancer in her mother; Diabetes in her maternal grandmother; Proteinuria in her daughter; Stroke in her mother.  ROS:   Please see the history of present illness.    Positive for DOE, easy bruising, palpitations, snoring.  All other systems reviewed and are negative.  EKGs/Labs/Other Studies Reviewed:    The following studies were reviewed today: Cardiac Cath 07-12-2019: Conclusion     Ost 2nd Mrg to 2nd Mrg lesion is 100% stenosed.  Balloon angioplasty was performed using a BALLOON SAPPHIRE 2.0X12.  Post intervention, there is a 25% residual stenosis.  The left ventricular systolic function is normal.  LV end diastolic pressure is mildly elevated.  The left ventricular ejection fraction is 55-65% by visual estimate.   1. Widely patent/angiographically normal left main, RCA, and LAD 2. Total occlusion of the second OM branch of the circumflex, treated successful with balloon angioplasty (stenting not  performed because of small vessel size and distal lesion location) 3. Mild contraction abnormality of the left ventricle with preserved overall LVEF  Recommend uninterrupted dual antiplatelet therapy with Aspirin 43m daily and Ticagrelor 967mtwice daily for a minimum of 12 months (ACS - Class I recommendation).  Indications   ST elevation myocardial infarction involving left circumflex coronary artery (HCC) [I21.21 (ICD-10-CM)]  Procedural Details   Technical Details INDICATION: [facility]fer[facility]I. [facility]ED[facility] DETAILS: The right wrist was prepped, draped, and anesthetized with 1% lidocaine. Using the modified Seldinger technique, a 5/6 French Slender sheath was introduced into the right radial artery. 3 mg of verapamil was administered through the sheath, weight-based unfractionated heparin was administered intravenously. Standard Judkins catheters were used  for selective coronary angiography and left ventriculography. PCI was performed with a 5 Fr EBU guide. Heparin is used for anticoagulation and a therapeutic ACT is achieved. The patient is loaded with ticagrelor 180 mg in the cath lab. Catheter exchanges were performed over an exchange length guidewire. There were no immediate procedural complications. A TR band was used for radial hemostasis at the completion of the procedure. The patient was transferred to the post catheterization recovery area for further monitoring.     Estimated blood loss <50 mL.  During this procedure the patient was administered the following to achieve and maintain moderate conscious sedation: Versed 3 mg, Fentanyl 75 mcg, while the patient's heart rate, blood pressure, and oxygen saturation were continuously monitored. The period of conscious sedation was 44 minutes, of which I was present face-to-face 100% of this time.  Medications  (Filter: Administrations occurring from 07/11/18 1550 to 07/11/18 1706)  Medication Rate/Dose/Volume Action  Date  Time   fentaNYL (SUBLIMAZE) injection (mcg) 25 mcg Given 07/11/18 1559   Total dose as of 11/11/18 1411 25 mcg Given 1611   75 mcg 25 mcg Given 1634   midazolam (VERSED) injection (mg) 1 mg Given 07/11/18 1559   Total dose as of 11/11/18 1411 1 mg Given 1611   3 mg 1 mg Given 1634   ondansetron (ZOFRAN) injection (mg) 4 mg Given 07/11/18 1559   Total dose as of 11/11/18 1411        4 mg        Heparin (Porcine) in NaCl 1000-0.9 UT/500ML-% SOLN (mL) 500 mL Given 07/11/18 1604   Total dose as of 11/11/18 1411 500 mL Given 1604   1,000 mL        lidocaine (PF) (XYLOCAINE) 1 % injection (mL) 2 mL Given 07/11/18 1604   Total dose as of 11/11/18 1411        2 mL        heparin injection (Units) 5,000 Units Given 07/11/18 1610   Total dose as of 11/11/18 1411        5,000 Units        ticagrelor (BRILINTA) tablet (mg) 180 mg Given 07/11/18 1659   Total dose as of 11/11/18 1411        180 mg        Sedation Time   Sedation Time Physician-1: 44 minutes 56 seconds  Coronary Findings   Diagnostic  Dominance: Right  Left Main  Vessel is angiographically normal.  Left Circumflex  Vessel is angiographically normal.  Second Obtuse Marginal Branch  Ost 2nd Mrg to 2nd Mrg lesion 100% stenosed  Ost 2nd Mrg to 2nd Mrg lesion is 100% stenosed. The lesion is thrombotic.  Right Coronary Artery  Vessel is moderate in size. Vessel is angiographically normal.  Intervention   Ost 2nd Mrg to 2nd Mrg lesion  Angioplasty  CATH LAUNCHER 46F EBU3.0 guide catheter was inserted. WIRE HI TORQ WHISPER MS 190CM guidewire used to cross lesion. Balloon angioplasty was performed using a BALLOON SAPPHIRE 2.0X12. Maximum pressure: 4 atm.  Post-Intervention Lesion Assessment  The intervention was successful. Pre-interventional TIMI flow is 0. Post-intervention TIMI flow is 3. No complications occurred at this lesion.  There is a 25% residual stenosis post intervention.  Wall Motion   Resting                 Left Heart   Left Ventricle The left ventricular size is normal. The left ventricular systolic function is normal. LV  end diastolic pressure is mildly elevated. The left ventricular ejection fraction is 55-65% by visual estimate. There are LV function abnormalities due to segmental dysfunction. Mild inferior hypokinesis, otherwise normal/vigorous wall motion  Coronary Diagrams   Diagnostic  Dominance: Right    Intervention     Echo 08-02-2018: Study Conclusions  - Left ventricle: The cavity size was normal. Systolic function was   normal. The estimated ejection fraction was in the range of 55%   to 60%. Hypokinesis of the inferolateral and inferior myocardium.   Indeterminate LV filling pressures. - Aortic valve: Transvalvular velocity was within the normal range.   There was no stenosis. There was no regurgitation. - Ascending aorta: The ascending aorta was normal in size. - Mitral valve: Transvalvular velocity was within the normal range.   There was no evidence for stenosis. There was mild to moderate   regurgitation. - Left atrium: The atrium was mildly to moderately dilated. - Right ventricle: The cavity size was normal. Wall thickness was   normal. Systolic function was normal. RV systolic pressure (S,   est): 28 mm Hg. - Right atrium: The atrium was normal in size. - Tricuspid valve: There was trivial regurgitation. - Inferior vena cava: The vessel was normal in size. The   respirophasic diameter changes were in the normal range (>= 50%),   consistent with normal central venous pressure. - Pericardium, extracardiac: There was no pericardial effusion.  EKG:  EKG is not ordered today.   Recent Labs: 07/24/2018: BUN 9; Creatinine, Ser 0.91; Hemoglobin 13.7; Platelets 311; Potassium 4.1; Sodium 142 09/03/2018: ALT 23  Recent Lipid Panel    Component Value Date/Time   CHOL 107 09/05/2018 0801   TRIG 53 09/05/2018 0801   HDL 45 09/05/2018 0801   CHOLHDL 2.4  09/05/2018 0801   CHOLHDL 3.4 07/12/2018 0634   VLDL 12 07/12/2018 0634   LDLCALC 51 09/05/2018 0801    Physical Exam:    VS:  BP 124/80   Pulse 78   Ht 5' 2"  (1.575 m)   Wt 253 lb 12.8 oz (115.1 kg)   SpO2 97%   BMI 46.42 kg/m     Wt Readings from Last 3 Encounters:  11/12/18 253 lb 12.8 oz (115.1 kg)  08/27/18 251 lb 3.2 oz (113.9 kg)  07/24/18 247 lb (112 kg)     GEN:  Well nourished, well developed, overweight woman in no acute distress HEENT: Normal NECK: No JVD; No carotid bruits LYMPHATICS: No lymphadenopathy CARDIAC: RRR, no murmurs, rubs, gallops RESPIRATORY:  Clear to auscultation without rales, wheezing or rhonchi  ABDOMEN: Soft, non-tender, non-distended MUSCULOSKELETAL:  No edema; No deformity  SKIN: Warm and dry NEUROLOGIC:  Alert and oriented x 3 PSYCHIATRIC:  Normal affect   ASSESSMENT:    1. Mixed hyperlipidemia   2. Coronary artery disease involving native coronary artery of native heart without angina pectoris   3. Shortness of breath    PLAN:    In order of problems listed above:  1. The patient's most recent lipids are reviewed and her results are excellent.  She has gained weight and we reviewed her weight trend today.  We discussed strategies that diet and exercise. 2. The patient is stable without symptoms of angina.  She should remain on dual antiplatelet therapy with aspirin and ticagrelor for 12 months which will take her to October 2020.  I asked her to increase her metoprolol to 25 mg twice daily which might help with her heart palpitations.  She  otherwise will continue her current program. 3. Likely related to weight gain.  Again we reviewed lifestyle modification with focus on weight loss.  LV function was normal at the time of her last echo and she had no significant residual CAD noted at cardiac catheterization.   Medication Adjustments/Labs and Tests Ordered: Current medicines are reviewed at length with the patient today.  Concerns  regarding medicines are outlined above.  No orders of the defined types were placed in this encounter.  Meds ordered this encounter  Medications  . DISCONTD: metoprolol tartrate (LOPRESSOR) 25 MG tablet    Sig: Take 1 tablet (25 mg total) by mouth 2 (two) times daily.    Dispense:  180 tablet    Refill:  3  . metoprolol tartrate (LOPRESSOR) 25 MG tablet    Sig: Take 1 tablet (25 mg total) by mouth 2 (two) times daily.    Dispense:  180 tablet    Refill:  3    Patient Instructions  Medication Instructions:  1) INCREASE METOPROLOL to 25 mg twice daily  Labwork: None  Testing/Procedures: None  Follow-Up: You have an appointment scheduled with Richardson Dopp on March 12, 2019 at 8:15AM.    Signed, Sherren Mocha, MD  11/12/2018 9:49 AM    [place]

## 2018-11-12 ENCOUNTER — Encounter: Payer: Self-pay | Admitting: Cardiovascular Disease

## 2018-11-12 ENCOUNTER — Ambulatory Visit (INDEPENDENT_AMBULATORY_CARE_PROVIDER_SITE_OTHER): Payer: Self-pay | Admitting: Cardiovascular Disease

## 2018-11-12 VITALS — BP 124/80 | HR 78 | Ht 62.0 in | Wt 253.8 lb

## 2018-11-12 DIAGNOSIS — I2510 Atherosclerotic heart disease of native coronary artery without angina pectoris: Secondary | ICD-10-CM

## 2018-11-12 DIAGNOSIS — R0602 Shortness of breath: Secondary | ICD-10-CM

## 2018-11-12 DIAGNOSIS — E782 Mixed hyperlipidemia: Secondary | ICD-10-CM

## 2018-11-12 MED ORDER — METOPROLOL TARTRATE 25 MG PO TABS: 25.0000 mg | ORAL_TABLET | Freq: Two times a day (BID) | ORAL | 3 refills | Status: DC

## 2018-11-12 MED FILL — ?METOPROLOL TARTRATE 25 MG: 25 | 30 days supply | Qty: 60 | Fill #0

## 2018-11-12 NOTE — Patient Instructions (Signed)
Medication Instructions:  1) INCREASE METOPROLOL to 25 mg twice daily  Labwork: None  Testing/Procedures: None  Follow-Up: You have an appointment scheduled with Erin Cantu on March 12, 2019 at 8:15AM.

## 2018-11-27 ENCOUNTER — Ambulatory Visit: Payer: Self-pay | Admitting: Family Medicine

## 2018-12-04 ENCOUNTER — Ambulatory Visit: Payer: Self-pay

## 2018-12-10 MED FILL — VALACYCLOVIR HCL 500 MG TAB: 500 | 15 days supply | Qty: 30 | Fill #3

## 2018-12-10 MED FILL — ATORVASTATIN 80 MG TABLET: 80 | 30 days supply | Qty: 30 | Fill #4

## 2018-12-10 MED FILL — ?OMEPRAZOLE 20 MG CAPSULE D: 20 | 30 days supply | Qty: 30 | Fill #3

## 2019-01-08 ENCOUNTER — Other Ambulatory Visit: Payer: Self-pay | Admitting: Family Medicine

## 2019-01-08 DIAGNOSIS — K219 Gastro-esophageal reflux disease without esophagitis: Secondary | ICD-10-CM

## 2019-01-09 ENCOUNTER — Other Ambulatory Visit: Payer: Self-pay

## 2019-01-09 DIAGNOSIS — K219 Gastro-esophageal reflux disease without esophagitis: Secondary | ICD-10-CM

## 2019-01-09 DIAGNOSIS — B009 Herpesviral infection, unspecified: Secondary | ICD-10-CM

## 2019-01-09 MED ORDER — OMEPRAZOLE 20 MG PO CPDR: 20.0000 mg | DELAYED_RELEASE_CAPSULE | Freq: Every day | ORAL | 3 refills | Status: DC

## 2019-01-09 MED ORDER — VALACYCLOVIR HCL 500 MG PO TABS: 500.0000 mg | ORAL_TABLET | Freq: Every day | ORAL | 3 refills | Status: DC

## 2019-01-09 NOTE — Progress Notes (Unsigned)
Medication sent to pharmacy and patient schedule a follow up phone visit

## 2019-01-11 ENCOUNTER — Ambulatory Visit (INDEPENDENT_AMBULATORY_CARE_PROVIDER_SITE_OTHER): Payer: Self-pay | Admitting: Family Medicine

## 2019-01-11 ENCOUNTER — Other Ambulatory Visit: Payer: Self-pay

## 2019-01-11 DIAGNOSIS — I252 Old myocardial infarction: Secondary | ICD-10-CM

## 2019-01-11 DIAGNOSIS — I10 Essential (primary) hypertension: Secondary | ICD-10-CM

## 2019-01-11 DIAGNOSIS — E782 Mixed hyperlipidemia: Secondary | ICD-10-CM

## 2019-01-11 DIAGNOSIS — R002 Palpitations: Secondary | ICD-10-CM

## 2019-01-11 DIAGNOSIS — Z09 Encounter for follow-up examination after completed treatment for conditions other than malignant neoplasm: Secondary | ICD-10-CM

## 2019-01-11 DIAGNOSIS — B009 Herpesviral infection, unspecified: Secondary | ICD-10-CM

## 2019-01-11 DIAGNOSIS — K219 Gastro-esophageal reflux disease without esophagitis: Secondary | ICD-10-CM

## 2019-01-11 MED ORDER — OMEPRAZOLE 20 MG PO CPDR: 20.0000 mg | DELAYED_RELEASE_CAPSULE | Freq: Every day | ORAL | 6 refills | Status: DC

## 2019-01-11 MED ORDER — ATORVASTATIN CALCIUM 80 MG PO TABS: 80.0000 mg | ORAL_TABLET | Freq: Every day | ORAL | 6 refills | Status: DC

## 2019-01-11 NOTE — Progress Notes (Signed)
Virtual Visit via Telephone Note  I connected with Erin Cantu on 01/13/19 at  8:20 AM EDT by telephone and verified that I am speaking with the correct person using two identifiers.   I discussed the limitations, risks, security and privacy concerns of performing an evaluation and management service by telephone and the availability of in person appointments. I also discussed with the patient that there may be a patient responsible charge related to this service. The patient expressed understanding and agreed to proceed.   History of Present Illness: Past Medical History:  Diagnosis Date  . CAD (coronary artery disease), native coronary artery    a. 07/2018 - total occlusion of the second OM with balloon angioplasty   . Cardiac arrest (HCC) 07/11/2018  . Echocardiogram    Echo 10/19: EF 55-60, inferolateral and inferior hypokinesis, mild to moderate mitral regurgitation, mild to moderate LAE, normal RVSF, trivial TR  . HLD (hyperlipidemia)   . Hyperlipidemia   . Hypertension   . STEMI (ST elevation myocardial infarction) (HCC) 07/2018  . Ventricular fibrillation [facility])      Current Outpatient Medications on File Prior to Visit  Medication Sig Dispense Refill  . aspirin EC 81 MG EC tablet Take 1 tablet (81 mg total) by mouth daily.    . cyclobenzaprine (FLEXERIL) 10 MG tablet Take 1 tablet (10 mg total) by mouth 3 (three) times daily. 30 tablet 2  . metoprolol tartrate (LOPRESSOR) 25 MG tablet Take 1 tablet (25 mg total) by mouth 2 (two) times daily. 180 tablet 3  . ticagrelor (BRILINTA) 90 MG TABS tablet Take 1 tablet (90 mg total) by mouth 2 (two) times daily. 60 tablet 11  . valACYclovir (VALTREX) 500 MG tablet Take 1 tablet (500 mg total) by mouth daily. Take 2 tablets (1,000 mg total) by mouth daily X 1 week for outbreaks. 30 tablet 3  . nitroGLYCERIN (NITROSTAT) 0.4 MG SL tablet Place 1 tablet (0.4 mg total) under the tongue every 5 (five) minutes x 3 doses as needed for chest  pain. 25 tablet 2   No current facility-administered medications on file prior to visit.    Current Status: Since her last office visit, she has experienced increased anxiety lately. She has recently been having occasional mild heart palpitations recently. She denies suicidal ideations, homicidal ideations, or auditory hallucinations. 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 She continues Valtrex as prescribed. She denies visual changes, chest pain, cough, shortness of breath, heart palpitations, and falls. She has occasional headaches and dizziness with position changes. Denies severe headaches, confusion, seizures, double vision, and blurred vision, nausea and vomiting.  She denies fevers, chills, fatigue, recent infections, weight loss, and night sweats. She has not had any visual changes, and falls. No chest pain, heart palpitations, cough and shortness of breath reported. No reports of GI problems such as diarrhea, and constipation. She has no reports of blood in stools, dysuria and hematuria. She denies pain today.   Observations/Objective:  Telephone Virtual Visit.   Assessment and Plan:  1. S/p: STEMI History of STEMI on 07/2018. Stable. No signs or symptoms of recurrence. Continue Brilinta. She will continue to decrease high sodium intake, excessive alcohol intake, increase potassium intake, smoking cessation, and increase physical activity of at least 30 minutes of cardio activity daily. She will continue to follow Heart Healthy or DASH diet.  2. Essential hypertension She will continue Metoprolol and Brilinta as prescribed. She will continue to decrease high sodium intake,  excessive alcohol intake, increase potassium intake, smoking cessation, and increase physical activity of at least 30 minutes of cardio activity daily. She will continue to follow Heart Healthy or DASH diet.  3. Mixed hyperlipidemia - atorvastatin (LIPITOR) 80 MG tablet; Take 1 tablet (80 mg  total) by mouth daily at 6 PM.  Dispense: 30 tablet; Refill: 6  4. Heart palpitations We will schedule her for evaluation of TSH.  - TSH; Future  5. Gastroesophageal reflux disease without esophagitis - omeprazole (PRILOSEC) 20 MG capsule; Take 1 capsule (20 mg total) by mouth daily.  Dispense: 30 capsule; Refill: 6  6s. Herpes Stable. She will continue Valtrex as needed.   Follow Up Instructions: She will follow up in 6 months for office visit. She will follow up in 1 week for labs.   I discussed the assessment and treatment plan with the patient. The patient was provided an opportunity to ask questions and all were answered. The patient agreed with the plan and demonstrated an understanding of the instructions.   The patient was advised to call back or seek an in-person evaluation if the symptoms worsen or if the condition fails to improve as anticipated.  I provided 15-20 minutes of non-face-to-face time during this encounter.   Kallie Locks, FNP

## 2019-01-13 ENCOUNTER — Encounter: Payer: Self-pay | Admitting: Family Medicine

## 2019-01-13 ENCOUNTER — Other Ambulatory Visit: Payer: Self-pay | Admitting: Family Medicine

## 2019-01-13 DIAGNOSIS — R002 Palpitations: Secondary | ICD-10-CM | POA: Insufficient documentation

## 2019-01-13 DIAGNOSIS — I252 Old myocardial infarction: Secondary | ICD-10-CM | POA: Insufficient documentation

## 2019-01-13 DIAGNOSIS — K219 Gastro-esophageal reflux disease without esophagitis: Secondary | ICD-10-CM | POA: Insufficient documentation

## 2019-01-13 DIAGNOSIS — B009 Herpesviral infection, unspecified: Secondary | ICD-10-CM | POA: Insufficient documentation

## 2019-01-14 MED FILL — ?OMEPRAZOLE 20 MG CAPSULE D: 20 | 90 days supply | Qty: 90 | Fill #0

## 2019-01-14 MED FILL — ATORVASTATIN 80 MG TABLET: 80 | 60 days supply | Qty: 60 | Fill #0

## 2019-01-14 MED FILL — VALACYCLOVIR HCL 500 MG TAB: 500 | 60 days supply | Qty: 60 | Fill #0

## 2019-01-16 ENCOUNTER — Other Ambulatory Visit: Payer: Self-pay

## 2019-01-16 DIAGNOSIS — R002 Palpitations: Secondary | ICD-10-CM

## 2019-01-17 LAB — TSH: TSH: 1.15 u[IU]/mL (ref 0.450–4.500)

## 2019-03-07 ENCOUNTER — Telehealth: Payer: Self-pay | Admitting: *Deleted

## 2019-03-07 NOTE — Telephone Encounter (Signed)
LVM TO CALL BACK FOR APPT SET UP  

## 2019-03-08 ENCOUNTER — Telehealth: Payer: Self-pay | Admitting: *Deleted

## 2019-03-08 NOTE — Telephone Encounter (Signed)
Virtual Visit Pre-Appointment Phone Call  "(Name), I am calling you today to discuss your upcoming appointment. We are currently trying to limit exposure to the virus that causes COVID-19 by seeing patients at home rather than in the office."  1. "What is the BEST phone number to call the day of the visit?" - include this in appointment notes  2. "Do you have or have access to (through a family member/friend) a smartphone with video capability that we can use for your visit?" a. If yes - list this number in appt notes as "cell" (if different from BEST phone #) and list the appointment type as a VIDEO visit in appointment notes b. If no - list the appointment type as a PHONE visit in appointment notes  3. Confirm consent - "In the setting of the current Covid19 crisis, you are scheduled for a (phone or video) visit with your provider on (date) at (time).  Just as we do with many in-office visits, in order for you to participate in this visit, we must obtain consent.  If you'd like, I can send this to your mychart (if signed up) or email for you to review.  Otherwise, I can obtain your verbal consent now.  All virtual visits are billed to your insurance company just like a normal visit would be.  By agreeing to a virtual visit, we'd like you to understand that the technology does not allow for your provider to perform an examination, and thus may limit your provider's ability to fully assess your condition. If your provider identifies any concerns that need to be evaluated in person, we will make arrangements to do so.  Finally, though the technology is pretty good, we cannot assure that it will always work on either your or our end, and in the setting of a video visit, we may have to convert it to a phone-only visit.  In either situation, we cannot ensure that we have a secure connection.  Are you willing to proceed?" STAFF: Did the patient verbally acknowledge consent to telehealth visit? Document  YES/NO here: YES  4. Advise patient to be prepared - "Two hours prior to your appointment, go ahead and check your blood pressure, pulse, oxygen saturation, and your weight (if you have the equipment to check those) and write them all down. When your visit starts, your provider will ask you for this information. If you have an Apple Watch or Kardia device, please plan to have heart rate information ready on the day of your appointment. Please have a pen and paper handy nearby the day of the visit as well."  5. Give patient instructions for MyChart download to smartphone OR Doximity/Doxy.me as below if video visit (depending on what platform provider is using)  6. Inform patient they will receive a phone call 15 minutes prior to their appointment time (may be from unknown caller ID) so they should be prepared to answer    TELEPHONE CALL NOTE  Erin Cantu has been deemed a candidate for a follow-up tele-health visit to limit community exposure during the Covid-19 pandemic. I spoke with the patient via phone to ensure availability of phone/video source, confirm preferred email & phone number, and discuss instructions and expectations.  I reminded Erin Cantu to be prepared with any vital sign and/or heart rhythm information that could potentially be obtained via home monitoring, at the time of her visit. I reminded Erin Cantu to expect a phone call prior to her visit.  Kriss Ishler, CMA 03/08/2019 12:05 PM   INSTRUCTIONS FOR DOWNLOADING THE MYCHART APP TO SMARTPHONE  - The patient must first make sure to have activated MyChart and know their login information - If Apple, go to [employer] Store and type in MyChart in the search bar and download the app. If Android, ask patient to go to [employer] Play Store and type in [place] in the search bar and download the app. The app is free but as with any other app downloads, their phone may require them to verify saved payment information or Apple/Android  password.  - The patient will need to then log into the app with their MyChart username and password, and select [facility] as their healthcare provider to link the account. When it is time for your visit, go to the MyChart app, find appointments, and click Begin Video Visit. Be sure to Select Allow for your device to access the Microphone and Camera for your visit. You will then be connected, and your provider will be with you shortly.  **If they have any issues connecting, or need assistance please contact MyChart service desk [phone])**  **If using a computer, in order to ensure the best quality for their visit they will need to use either of the following Internet Browsers: [employer] Edge, or Google Chrome**  IF USING DOXIMITY or DOXY.ME - The patient will receive a link just prior to their visit by text.     FULL LENGTH CONSENT FOR TELE-HEALTH VISIT   I hereby voluntarily request, consent and authorize [facility] and its employed or contracted physicians, physician assistants, nurse practitioners or other licensed health care professionals (the Practitioner), to provide me with telemedicine health care services (the "Services") as deemed necessary by the treating Practitioner. I acknowledge and consent to receive the Services by the Practitioner via telemedicine. I understand that the telemedicine visit will involve communicating with the Practitioner through live audiovisual communication technology and the disclosure of certain medical information by electronic transmission. I acknowledge that I have been given the opportunity to request an in-person assessment or other available alternative prior to the telemedicine visit and am voluntarily participating in the telemedicine visit.  I understand that I have the right to withhold or withdraw my consent to the use of telemedicine in the course of my care at any time, without affecting my right to future care or treatment,  and that the Practitioner or I may terminate the telemedicine visit at any time. I understand that I have the right to inspect all information obtained and/or recorded in the course of the telemedicine visit and may receive copies of available information for a reasonable fee.  I understand that some of the potential risks of receiving the Services via telemedicine include:  Marland Kitchen. Delay or interruption in medical evaluation due to technological equipment failure or disruption; . Information transmitted may not be sufficient (e.g. poor resolution of images) to allow for appropriate medical decision making by the Practitioner; and/or  . In rare instances, security protocols could fail, causing a breach of personal health information.  Furthermore, I acknowledge that it is my responsibility to provide information about my medical history, conditions and care that is complete and accurate to the best of my ability. I acknowledge that Practitioner's advice, recommendations, and/or decision may be based on factors not within their control, such as incomplete or inaccurate data provided by me or distortions of diagnostic images or specimens that may result from electronic transmissions. I understand that the practice  of medicine is not an Chief Strategy Officer and that Practitioner makes no warranties or guarantees regarding treatment outcomes. I acknowledge that I will receive a copy of this consent concurrently upon execution via email to the email address I last provided but may also request a printed copy by calling the office of [place].    I understand that my insurance will be billed for this visit.   I have read or had this consent read to me. . I understand the contents of this consent, which adequately explains the benefits and risks of the Services being provided via telemedicine.  . I have been provided ample opportunity to ask questions regarding this consent and the Services and have had my questions  answered to my satisfaction. . I give my informed consent for the services to be provided through the use of telemedicine in my medical care  By participating in this telemedicine visit I agree to the above.

## 2019-03-11 DIAGNOSIS — I2510 Atherosclerotic heart disease of native coronary artery without angina pectoris: Secondary | ICD-10-CM | POA: Insufficient documentation

## 2019-03-11 MED FILL — ?ATORVASTATIN 40MG TABLET: 40 | 30 days supply | Qty: 60 | Fill #0

## 2019-03-11 NOTE — Progress Notes (Signed)
Virtual Visit via Telephone Note   This visit type was conducted due to national recommendations for restrictions regarding the COVID-19 Pandemic (e.g. social distancing) in an effort to limit this patient's exposure and mitigate transmission in our community.  Due to her co-morbid illnesses, this patient is at least at moderate risk for complications without adequate follow up.  This format is felt to be most appropriate for this patient at this time.  The patient did not have access to video technology/had technical difficulties with video requiring transitioning to audio format only (telephone).  All issues noted in this document were discussed and addressed.  No physical exam could be performed with this format.  Please refer to the patient's chart for her  consent to telehealth for [facility].   Date:  03/12/2019   ID:  [name], DOB 15-May-1966, MRN [id number]  Patient Location: Home Provider Location: Home  PCP:  Kallie Locks, FNP  Cardiologist:  Tonny Bollman, MD   Electrophysiologist:  None   Evaluation Performed:  Follow-Up Visit  Chief Complaint:  FU on CAD  History of Present Illness:    Erin Cantu is a 53 y.o. female with coronary artery disease s/p inferior STEMI in 07/2018 assoc with VF arrest.  She underwent POBA to the OM2 (vessel was too small for stenting).   She was last seen by Dr. Excell Seltzer in 11/2018.  Today, she notes she continues to have palpitations.  She usually notes these in the AM when she awakens.  They are rapid and last about 5 minutes.  She has some occasional chest pain described as pressure.  She does not feel the symptoms are as severe as her myocardial infarction and are not related to exertion.  She has no assoc shortness of breath, nausea, diaphoresis.  She has not had syncope, orthopnea, paroxysmal nocturnal dyspnea, edema.  She has a lot of bruising.  She has not had blood in her stools or urine.    The patient does not have symptoms  concerning for COVID-19 infection (fever, chills, cough, or new shortness of breath).    Past Medical History:  Diagnosis Date  . CAD (coronary artery disease), native coronary artery    a. 07/2018 - total occlusion of the second OM with balloon angioplasty   . Cardiac arrest (HCC) 07/11/2018  . Echocardiogram    Echo 10/19: EF 55-60, inferolateral and inferior hypokinesis, mild to moderate mitral regurgitation, mild to moderate LAE, normal RVSF, trivial TR  . Herpes   . HLD (hyperlipidemia)   . Hyperlipidemia   . Hypertension   . STEMI (ST elevation myocardial infarction) (HCC) 07/2018  . Ventricular fibrillation [facility])    Past Surgical History:  Procedure Laterality Date  . ABDOMINAL HYSTERECTOMY  09/2005   done for fibroids  . CORONARY BALLOON ANGIOPLASTY N/A 07/11/2018   Procedure: CORONARY BALLOON ANGIOPLASTY;  Surgeon: Tonny Bollman, MD;  Location: [facility] INVASIVE CV LAB;  Service: Cardiovascular;  Laterality: N/A;  . LEFT HEART CATH AND CORONARY ANGIOGRAPHY N/A 07/11/2018   Procedure: LEFT HEART CATH AND CORONARY ANGIOGRAPHY;  Surgeon: Tonny Bollman, MD;  Location[facility] INVASIVE CV LAB;  Service: Cardiovascular;  Laterality: N/A;     Current Meds  Medication Sig  . aspirin EC 81 MG EC tablet Take 1 tablet (81 mg total) by mouth daily.  Marland Kitchen atorvastatin (LIPITOR) 80 MG tablet Take 1 tablet (80 mg total) by mouth daily at 6 PM.  . cyclobenzaprine (FLEXERIL) 10 MG tablet Take 1 tablet (10  mg total) by mouth 3 (three) times daily.  . metoprolol tartrate (LOPRESSOR) 25 MG tablet Take 1 tablet (25 mg total) by mouth 2 (two) times daily.  . nitroGLYCERIN (NITROSTAT) 0.4 MG SL tablet Place 1 tablet (0.4 mg total) under the tongue every 5 (five) minutes x 3 doses as needed for chest pain.  Marland Kitchen omeprazole (PRILOSEC) 20 MG capsule Take 1 capsule (20 mg total) by mouth daily.  . ticagrelor (BRILINTA) 90 MG TABS tablet Take 1 tablet (90 mg total) by mouth 2 (two) times daily.  . valACYclovir  (VALTREX) 500 MG tablet Take 1 tablet (500 mg total) by mouth daily. Take 2 tablets (1,000 mg total) by mouth daily X 1 week for outbreaks.     Allergies:   Patient has no known allergies.   Social History   Tobacco Use  . Smoking status: Never Smoker  . Smokeless tobacco: Never Used  Substance Use Topics  . Alcohol use: No    Frequency: Never  . Drug use: No     Family Hx: The patient's family history includes COPD in her father; Cancer in her brother and paternal grandmother; Colon cancer in her mother; Diabetes in her maternal grandmother; Proteinuria in her daughter; Stroke in her mother.  ROS:   Please see the history of present illness.     All other systems reviewed and are negative.   Prior CV studies:   The following studies were reviewed today:  Echo 08/02/18 EF 55-60, inf-lat and in HK, mild to mod MR, mild to mod LAE, RVSP 28, trivial TR  Cardiac catheterization 07/11/2018 LM normal LCx normal; OM 2 100-thrombotic RCA normal EF 55-65, inf HK PCI: POBA to the OM2 with residual 25 1. Widely patent/angiographically normal left main, RCA, and LAD 2. 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 Mild contraction abnormality of the left ventricle with preserved overall LVEF   Nuclear stress test 02/25/2004 IMPRESSION  No convincing evidence of myocardial ischemia or infarction. The computer generated polar map suggests anterior reversibility, not seen on the images. Wall motion is normal throughout and the QGSEF measures 67 percent.    Labs/Other Tests and Data Reviewed:    EKG:  No ECG reviewed.  Recent Labs: 07/24/2018: BUN 9; Creatinine, Ser 0.91; Hemoglobin 13.7; Platelets 311; Potassium 4.1; Sodium 142 09/03/2018: ALT 23 01/16/2019: TSH 1.150   Recent Lipid Panel Lab Results  Component Value Date/Time   CHOL 107  09/05/2018 08:01 AM   TRIG 53 09/05/2018 08:01 AM   HDL 45 09/05/2018 08:01 AM   CHOLHDL 2.4 09/05/2018 08:01 AM   CHOLHDL 3.4 07/12/2018 06:34 AM   LDLCALC 51 09/05/2018 08:01 AM     Wt Readings from Last 3 Encounters:  03/12/19 253 lb (114.8 kg)  11/12/18 253 lb 12.8 oz (115.1 kg)  08/27/18 251 lb 3.2 oz (113.9 kg)     Objective:    Vital Signs:  Ht 5\' 2"  (1.575 m)   Wt 253 lb (114.8 kg)   BMI 46.27 kg/m    VITAL SIGNS:  reviewed GEN:  no acute distress RESPIRATORY:  no labored breathing NEURO:  alert and oriented PSYCH:  normal mood  ASSESSMENT & PLAN:    Coronary artery disease involving native coronary artery of native heart without angina pectoris Hx of inferior STEMI c/b VF arrest tx with POBA to the OM2.  She had  no significant dz elsewhere.  She has occasional episodes of chest pain.  This is overall stable and not really related to exertion.  Her OM2 was too small for stenting.  She may have recurrent dz in the OM2.  For now, I have asked her to monitor her symptoms.  I will arrange for her to get a BP cuff.  If her BP could tolerate, I could add low dose nitrates or amlodipine.  She has a lot of bruising.  I will discuss further with Dr. Excell Seltzerooper (DC Brilinta vs change Brilinta to Plavix?).  Continue ASA, Brilnta, statin, beta-blocker.  She has asked for a letter for work.  She lost her last job.  I have advised her that general restrictions for a patient post MI would be to avoid extreme temperatures and to avoid extreme straining.  I will write a letter for her to use as she looks for work.    Essential hypertension She does not have a BP cuff.  I will arrange for her to get one through our [facility].    Mixed hyperlipidemia LDL optimal on most recent lab work.  Continue current Rx.    Palpitations Her beta-blocker was increased at the last office visit.  I will arrange for a Zio monitor to evaluate her symptoms.  COVID-19 Education: The signs and symptoms of  COVID-19 were discussed with the patient and how to seek care for testing (follow up with PCP or arrange E-visit).  The importance of social distancing was discussed today.  Time:   Today, I have spent 21 minutes with the patient with telehealth technology discussing the above problems.     Medication Adjustments/Labs and Tests Ordered: Current medicines are reviewed at length with the patient today.  Concerns regarding medicines are outlined above.   Tests Ordered: Orders Placed This Encounter  Procedures  . LONG TERM MONITOR (3-14 DAYS)    Medication Changes: No orders of the defined types were placed in this encounter.   Disposition:  Follow up in 3 month(s) with Dr. Excell Seltzerooper or Tereso NewcomerScott Naiya Corral   Signed, Tereso NewcomerScott Mahmoud Blazejewski, PA-C  03/12/2019 1:13 PM    [facility]

## 2019-03-12 ENCOUNTER — Telehealth: Payer: Self-pay | Admitting: Licensed Clinical Social Worker

## 2019-03-12 ENCOUNTER — Encounter: Payer: Self-pay | Admitting: Physician Assistant

## 2019-03-12 ENCOUNTER — Telehealth: Payer: Self-pay | Admitting: Physician Assistant

## 2019-03-12 ENCOUNTER — Ambulatory Visit: Payer: Self-pay | Admitting: Physician Assistant

## 2019-03-12 ENCOUNTER — Telehealth: Payer: Self-pay | Admitting: Radiology

## 2019-03-12 ENCOUNTER — Other Ambulatory Visit: Payer: Self-pay

## 2019-03-12 ENCOUNTER — Telehealth (INDEPENDENT_AMBULATORY_CARE_PROVIDER_SITE_OTHER): Payer: Self-pay | Admitting: Physician Assistant

## 2019-03-12 VITALS — Ht 62.0 in | Wt 253.0 lb

## 2019-03-12 DIAGNOSIS — Z7189 Other specified counseling: Secondary | ICD-10-CM

## 2019-03-12 DIAGNOSIS — R002 Palpitations: Secondary | ICD-10-CM

## 2019-03-12 DIAGNOSIS — I2510 Atherosclerotic heart disease of native coronary artery without angina pectoris: Secondary | ICD-10-CM

## 2019-03-12 DIAGNOSIS — I10 Essential (primary) hypertension: Secondary | ICD-10-CM

## 2019-03-12 DIAGNOSIS — E782 Mixed hyperlipidemia: Secondary | ICD-10-CM

## 2019-03-12 MED ORDER — PANTOPRAZOLE SODIUM 40 MG PO TBEC: 40.0000 mg | DELAYED_RELEASE_TABLET | Freq: Every day | ORAL | 11 refills | Status: DC | PRN

## 2019-03-12 MED ORDER — CLOPIDOGREL BISULFATE 75 MG PO TABS: ORAL_TABLET | ORAL | 3 refills | Status: DC

## 2019-03-12 MED FILL — ?METOPROLOL 25 MG TABLET: 25 | 30 days supply | Qty: 60 | Fill #0

## 2019-03-12 NOTE — Telephone Encounter (Signed)
Enrolled patient for a 14 Day Zio monitor to be mailed. Brief instructions were gone over with the patient and she knows to expect the monitor to arrive in 3-4 days.  *patient is currently self pay. Instructed her to talk to [facility] about hardship/selfpay options

## 2019-03-12 NOTE — Telephone Encounter (Signed)
Please call the patient. I reviewed her case with Dr. Excell Seltzer.  Because of her bruising, we can change her from Brilinta to Plavix.  This should help lessen the bruising. Prilosec (omperazole) can make Plavix less effective.  Therefore, we will need to switch from Prilosec to Protonix. PLAN:  1. Stop Brilinta (last dose should be the PM dose). 2. Start Plavix the next AM.      - On day 1, the first dose of Plavix is 300 mg (she will take 4 of the 75 mg tablets to = 300 mg).     - On day 2, she will start taking 75 mg once daily  3. I have sent a Rx to her pharmacy Public relations account executive in [place]). 4. DC Prilosec (omeprazole). 5. Start Protonix 40 mg once daily as needed for indigestion. Tereso Newcomer, PA-C    03/12/2019 5:16 PM

## 2019-03-12 NOTE — Patient Instructions (Addendum)
Medication Instructions:  NONE ORDERED  TODAY   If you need a refill on your cardiac medications before your next appointment, please call your pharmacy.    Lab work: NONE ORDERED  TODAY   If you have labs (blood work) drawn today and your tests are completely normal, you will receive your results only by: Marland Kitchen MyChart Message (if you have MyChart) OR . A paper copy in the mail If you have any lab test that is abnormal or we need to change your treatment, we will call you to review the results.   Testing/Procedures: Your physician has recommended that you wear an event monitor. Event monitors are medical devices that record the heart's electrical activity. Doctors most often [place] these monitors to diagnose arrhythmias. Arrhythmias are problems with the speed or rhythm of the heartbeat. The monitor is a small, portable device. You can wear one while you do your normal daily activities. This is usually used to diagnose what is causing palpitations/syncope (passing out).     Follow-Up: At [facility], you and your health needs are our priority.  As part of our continuing mission to provide you with exceptional heart care, we have created designated Provider Care Teams.  These Care Teams include your primary Cardiologist (physician) and Advanced Practice Providers (APPs -  Physician Assistants and Nurse Practitioners) who all work together to provide you with the care you need, when you need it. You will need a follow up appointment in:  3 months.  Please call our office 2 months in advance to schedule this appointment.  You may see Tonny Bollman, MD or one of the following Advanced Practice Providers on your designated Care Team: Tereso Newcomer, PA-C Vin Sargent, [place] . Berton Bon, NP     Special Instructions:  FOR NEXT 3 TO 4  WEEKS TAKE BLOOD PRESSURE KEEP A DAILY LOG AND CONTACT [facility] BACK WITH READINGS. A SOCIAL WORKER HAS BEEN CONTACTED FOR ASSISTANCE FOR AN BP CUFF

## 2019-03-12 NOTE — Telephone Encounter (Signed)
CSW referred to assist patient with obtaining a BP cuff. CSW contacted patient to inform cuff will be delivered to home end of the week. Message left for patient.  CSW available as needed. Lasandra Beech, LCSW, CCSW-MCS [phone]

## 2019-03-13 MED ORDER — CLOPIDOGREL BISULFATE 75 MG PO TABS: ORAL_TABLET | ORAL | 3 refills | Status: DC

## 2019-03-13 MED ORDER — PANTOPRAZOLE SODIUM 40 MG PO TBEC: 40.0000 mg | DELAYED_RELEASE_TABLET | Freq: Every day | ORAL | 11 refills | Status: DC | PRN

## 2019-03-13 MED FILL — CLOPIDOGREL 75 MG TABLET: 75 | 31 days supply | Qty: 34 | Fill #0

## 2019-03-13 MED FILL — ?PANTOPRAZOLE SODI DR 40MGT: 40 | 30 days supply | Qty: 30 | Fill #0

## 2019-03-13 NOTE — Telephone Encounter (Signed)
Patient asked if Rx could be sent in to [facility]. Rx resent in. Pt stated once she receives medicine in mail from pharmacy she will follow medications instructions.

## 2019-03-13 NOTE — Telephone Encounter (Signed)
Called  the patient and discussed Alben Spittle reccomendations  Stop Brilinta (last dose should be the PM dose).  Start Plavix the next AM.      - On day 1, the first dose of Plavix is 300 mg (she will take 4 of the 75 mg tablets to = 300 mg).     - On day 2, she will start taking 75 mg once daily   I have sent a Rx to her pharmacy Public relations account executive in [place]). . DC Prilosec (omeprazole).. Start Protonix 40 mg once daily as needed for indigestion Patient agreed with recommendations and patient asked for letter to be mailed to home.

## 2019-03-13 NOTE — Telephone Encounter (Signed)
Lvm with patient mother to call back clinic about recommendations.

## 2019-03-18 ENCOUNTER — Telehealth: Payer: Self-pay | Admitting: Physician Assistant

## 2019-03-18 NOTE — Telephone Encounter (Signed)
Patient received her monitor, but does not have the money to pay for the testing. She talked to [facility], and they did not offer any sort of discount or payment plans. She states the monitor is still in the box. She wants to know what to do next.

## 2019-03-18 NOTE — Telephone Encounter (Signed)
Will forward to Monitor techs for help.

## 2019-03-18 NOTE — Telephone Encounter (Signed)
Attempted to call patient back to offer [employer] 14 day long term holter monitor at self pay cost of $275.  No answer, No DPR to leave message.

## 2019-03-29 ENCOUNTER — Telehealth: Payer: Self-pay

## 2019-03-29 ENCOUNTER — Telehealth: Payer: Self-pay | Admitting: Physician Assistant

## 2019-03-29 NOTE — Telephone Encounter (Signed)
Patient is negative for the screening and will becoming in the office  

## 2019-03-29 NOTE — Telephone Encounter (Addendum)
Patient called today stating she is waiting on a letting stating what type of work she is able to do.  There is a letter on file.  I read to her what letter said, she has question about the 100lbs, she states Scott told her nothing over 50lbs.

## 2019-03-29 NOTE — Telephone Encounter (Signed)
Patient called today stating she called the company when she got the monitor and they were not willing to work with her for a payment plan or had any type of hardship program.  She sent the monitor back without wearing it.

## 2019-04-01 ENCOUNTER — Ambulatory Visit (INDEPENDENT_AMBULATORY_CARE_PROVIDER_SITE_OTHER): Payer: Self-pay | Admitting: Family Medicine

## 2019-04-01 ENCOUNTER — Other Ambulatory Visit: Payer: Self-pay

## 2019-04-01 ENCOUNTER — Ambulatory Visit: Payer: Self-pay | Admitting: Family Medicine

## 2019-04-01 ENCOUNTER — Encounter: Payer: Self-pay | Admitting: Family Medicine

## 2019-04-01 ENCOUNTER — Encounter: Payer: Self-pay | Admitting: Physician Assistant

## 2019-04-01 VITALS — BP 120/76 | HR 60 | Temp 98.2°F | Ht 62.0 in | Wt 263.0 lb

## 2019-04-01 DIAGNOSIS — R002 Palpitations: Secondary | ICD-10-CM

## 2019-04-01 DIAGNOSIS — S161XXA Strain of muscle, fascia and tendon at neck level, initial encounter: Secondary | ICD-10-CM

## 2019-04-01 DIAGNOSIS — R0789 Other chest pain: Secondary | ICD-10-CM

## 2019-04-01 DIAGNOSIS — K219 Gastro-esophageal reflux disease without esophagitis: Secondary | ICD-10-CM

## 2019-04-01 DIAGNOSIS — I252 Old myocardial infarction: Secondary | ICD-10-CM

## 2019-04-01 DIAGNOSIS — Z09 Encounter for follow-up examination after completed treatment for conditions other than malignant neoplasm: Secondary | ICD-10-CM

## 2019-04-01 LAB — POCT URINALYSIS DIP (MANUAL ENTRY)
Bilirubin, UA: NEGATIVE
Blood, UA: NEGATIVE
Glucose, UA: NEGATIVE mg/dL
Leukocytes, UA: NEGATIVE
Nitrite, UA: NEGATIVE
Protein Ur, POC: NEGATIVE mg/dL
Spec Grav, UA: 1.025 (ref 1.010–1.025)
Urobilinogen, UA: 1 E.U./dL
pH, UA: 6.5 (ref 5.0–8.0)

## 2019-04-01 MED ORDER — PANTOPRAZOLE SODIUM 40 MG PO TBEC: 40.0000 mg | DELAYED_RELEASE_TABLET | Freq: Two times a day (BID) | ORAL | 1 refills | Status: DC

## 2019-04-01 MED ORDER — CYCLOBENZAPRINE HCL 10 MG PO TABS: 10.0000 mg | ORAL_TABLET | Freq: Three times a day (TID) | ORAL | 3 refills | Status: DC

## 2019-04-01 NOTE — Progress Notes (Signed)
[facility]   Established Patient Office Visit  Subjective:  Patient ID: Erin Cantu, female    DOB: [birth year]  Age: 53 y.o. MRN: [id number]  CC:  Chief Complaint  Patient presents with  . Shoulder Pain    left  . Neck Pain    HPI Erin Cantu is a 53 year old female who presents for Follow Up today.   Past Medical History:  Diagnosis Date  . CAD (coronary artery disease), native coronary artery    a. 07/2018 - total occlusion of the second OM with balloon angioplasty   . Cardiac arrest (HCC) 07/11/2018  . Echocardiogram    Echo 10/19: EF 55-60, inferolateral and inferior hypokinesis, mild to moderate mitral regurgitation, mild to moderate LAE, normal RVSF, trivial TR  . Herpes   . HLD (hyperlipidemia)   . Hyperlipidemia   . Hypertension   . STEMI (ST elevation myocardial infarction) (HCC) 07/2018  . Ventricular fibrillation (HCC)    Current Status: Since her last office visit, she reports chest discomfort and heart palpitations, especially at night. She has not been able to afford Holter Monitor. She states that she has not been able to apply for [facility] Financial Assistance as of yet. She denies visual changes, chest pain, cough, shortness of breath, and falls. She has occasional headaches and dizziness with position changes. Denies severe headaches, confusion, seizures, double vision, and blurred vision, nausea and vomiting. Her anxiety is moderate today. She reports that her father is sick. She reports chronic mild left shoulder and arm pain. She recently had virtual visit with her Cardiologist. She takes Protonix daily.   She denies fevers, chills, fatigue, recent infections, weight loss, and night sweats. She has not had any visual changes, and falls. No chest pain, heart palpitations, cough and shortness of breath reported. No reports of GI problems such as diarrhea, and constipation. She has no reports of blood in stools,  dysuria and hematuria.  Past Surgical History:  Procedure Laterality Date  . ABDOMINAL HYSTERECTOMY  09/2005   done for fibroids  . CORONARY BALLOON ANGIOPLASTY N/A 07/11/2018   Procedure: CORONARY BALLOON ANGIOPLASTY;  Surgeon: Tonny Bollmanooper, Michael, MD;  [facility];  Service: Cardiovascular;  Laterality: N/A;  . LEFT HEART CATH AND CORONARY ANGIOGRAPHY N/A 07/11/2018   Procedure: LEFT HEART CATH AND CORONARY ANGIOGRAPHY;  Surgeon: Tonny Bollmanooper, Michael, MD;  [facility];  Service: Cardiovascular;  Laterality: N/A;    Family History  Problem Relation Age of Onset  . Cancer Paternal Grandmother        breast  . Diabetes Maternal Grandmother   . COPD Father   . Stroke Mother   . Colon cancer Mother   . Cancer Brother        metastatic lung cancer  . Proteinuria Daughter     Social History   Socioeconomic History  . Marital status: Divorced    Spouse name: Not on file  . Number of children: Not on file  . Years of education: Not on file  . Highest education level: Not on file  Occupational History  . Not on file  Social Needs  . Financial resource strain: Not on file  . Food insecurity    Worry: Not on file    Inability: Not on file  . Transportation needs    Medical: Not on file    Non-medical: Not on file  Tobacco Use  . Smoking status: Never Smoker  .  Smokeless tobacco: Never Used  Substance and Sexual Activity  . Alcohol use: No    Frequency: Never  . Drug use: No  . Sexual activity: Not Currently    Birth control/protection: Post-menopausal, Surgical    Comment: hysterectomy  Lifestyle  . Physical activity    Days per week: Not on file    Minutes per session: Not on file  . Stress: Not on file  Relationships  . Social Herbalist on phone: Not on file    Gets together: Not on file    Attends religious service: Not on file    Active member of club or organization: Not on file    Attends meetings of clubs or organizations:  Not on file    Relationship status: Not on file  . Intimate partner violence    Fear of current or ex partner: Not on file    Emotionally abused: Not on file    Physically abused: Not on file    Forced sexual activity: Not on file  Other Topics Concern  . Not on file  Social History Narrative  . Not on file    Outpatient Medications Prior to Visit  Medication Sig Dispense Refill  . aspirin EC 81 MG EC tablet Take 1 tablet (81 mg total) by mouth daily.    Marland Kitchen atorvastatin (LIPITOR) 80 MG tablet Take 1 tablet (80 mg total) by mouth daily at 6 PM. (Patient taking differently: 80 mg daily at 6 PM. ) 30 tablet 6  . clopidogrel (PLAVIX) 75 MG tablet Take 4 tabs (300 mg) once.  Then, on the next day, start 1 tab (75 mg) once daily 90 tablet 3  . metoprolol tartrate (LOPRESSOR) 25 MG tablet Take 1 tablet (25 mg total) by mouth 2 (two) times daily. 180 tablet 3  . nitroGLYCERIN (NITROSTAT) 0.4 MG SL tablet Place 1 tablet (0.4 mg total) under the tongue every 5 (five) minutes x 3 doses as needed for chest pain. 25 tablet 2  . valACYclovir (VALTREX) 500 MG tablet Take 1 tablet (500 mg total) by mouth daily. Take 2 tablets (1,000 mg total) by mouth daily X 1 week for outbreaks. 30 tablet 3  . cyclobenzaprine (FLEXERIL) 10 MG tablet Take 1 tablet (10 mg total) by mouth 3 (three) times daily. 30 tablet 2  . pantoprazole (PROTONIX) 40 MG tablet Take 1 tablet (40 mg total) by mouth daily as needed (indigestion). 30 tablet 11   No facility-administered medications prior to visit.     No Known Allergies  ROS Review of Systems  Constitutional: Negative.   HENT: Negative.   Eyes: Negative.   Respiratory: Negative.   Cardiovascular: Negative.   Gastrointestinal: Negative.   Endocrine: Negative.   Genitourinary: Negative.   Musculoskeletal: Negative.   Skin: Negative.   Allergic/Immunologic: Negative.   Neurological: Negative.   Hematological: Negative.   Psychiatric/Behavioral: Negative.     Objective:    Physical Exam  Constitutional: She is oriented to person, place, and time. She appears well-developed and well-nourished.  HENT:  Head: Atraumatic.  Eyes: Conjunctivae are normal.  Neck: Normal range of motion. Neck supple.  Cardiovascular: Normal rate, regular rhythm, normal heart sounds and intact distal pulses.  Pulmonary/Chest: Effort normal and breath sounds normal.  Abdominal: Soft. Bowel sounds are normal.  Musculoskeletal: Normal range of motion.  Neurological: She is alert and oriented to person, place, and time. She has normal reflexes.  Skin: Skin is warm and dry.  Psychiatric: She  has a normal mood and affect. Her behavior is normal. Judgment and thought content normal.  Nursing note and vitals reviewed.   BP 120/76 (BP Location: Right Arm, Patient Position: Sitting, Cuff Size: Large)   Pulse 60   Temp 98.2 F (36.8 C) (Oral)   Ht 5\' 2"  (1.575 m)   Wt 263 lb (119.3 kg)   SpO2 100%   BMI 48.10 kg/m  Wt Readings from Last 3 Encounters:  04/01/19 263 lb (119.3 kg)  03/12/19 253 lb (114.8 kg)  11/12/18 253 lb 12.8 oz (115.1 kg)     Health Maintenance Due  Topic Date Due  . MAMMOGRAM  02/23/2018  . COLONOSCOPY  02/23/2018    There are no preventive care reminders to display for this patient.  Lab Results  Component Value Date   TSH 1.150 01/16/2019   Lab Results  Component Value Date   WBC 5.7 07/24/2018   HGB 13.7 07/24/2018   HCT 42.2 07/24/2018   MCV 89 07/24/2018   PLT 311 07/24/2018   Lab Results  Component Value Date   NA 142 07/24/2018   K 4.1 07/24/2018   CO2 25 07/24/2018   GLUCOSE 111 (H) 07/24/2018   BUN 9 07/24/2018   CREATININE 0.91 07/24/2018   BILITOT 0.5 09/03/2018   ALKPHOS 115 09/03/2018   AST 16 09/03/2018   ALT 23 09/03/2018   PROT 6.4 09/03/2018   ALBUMIN 4.1 09/03/2018   CALCIUM 9.5 07/24/2018   ANIONGAP 7 07/12/2018   Lab Results  Component Value Date   CHOL 107 09/05/2018   Lab Results  Component  Value Date   HDL 45 09/05/2018   Lab Results  Component Value Date   LDLCALC 51 09/05/2018   Lab Results  Component Value Date   TRIG 53 09/05/2018   Lab Results  Component Value Date   CHOLHDL 2.4 09/05/2018   Lab Results  Component Value Date   HGBA1C 5.4 07/24/2018      Assessment & Plan:   1. History of ST elevation myocardial infarction (STEMI) No signs and symptoms of recurrence noted or reported.   2. Heart palpitations Stable today.  - EKG 12-Lead  3. Chest discomfort EKG is normal.  - EKG 12-Lead  4. Gastroesophageal reflux disease without esophagitis We give a trial increase dose of Protonix to BID.  - pantoprazole (PROTONIX) 40 MG tablet; Take 1 tablet (40 mg total) by mouth 2 (two) times daily before a meal.  Dispense: 60 tablet; Refill: 1  5. Neck muscle strain, initial encounter - cyclobenzaprine (FLEXERIL) 10 MG tablet; Take 1 tablet (10 mg total) by mouth 3 (three) times daily.  Dispense: 30 tablet; Refill: 3  6. Follow up She will follow up in 1 month.  - POCT urinalysis dipstick  Meds ordered this encounter  Medications  . cyclobenzaprine (FLEXERIL) 10 MG tablet    Sig: Take 1 tablet (10 mg total) by mouth 3 (three) times daily.    Dispense:  30 tablet    Refill:  3  . pantoprazole (PROTONIX) 40 MG tablet    Sig: Take 1 tablet (40 mg total) by mouth 2 (two) times daily before a meal.    Dispense:  60 tablet    Refill:  1    Orders Placed This Encounter  Procedures  . POCT urinalysis dipstick  . EKG 12-Lead    Referral Orders  No referral(s) requested today    Raliegh IpNatalie Dakia Schifano,  MSN, FNP-BC [facility] [address]  [place], [place] [address] [phone]  Problem List Items Addressed This Visit      Digestive   Gastroesophageal reflux disease without esophagitis   Relevant Medications   pantoprazole (PROTONIX) 40 MG tablet     Other   Heart palpitations   Relevant Orders   EKG  12-Lead   History of ST elevation myocardial infarction (STEMI) - Primary    Other Visit Diagnoses    Chest discomfort       Relevant Orders   EKG 12-Lead   Neck muscle strain, initial encounter       Relevant Medications   cyclobenzaprine (FLEXERIL) 10 MG tablet   Follow up       Relevant Orders   POCT urinalysis dipstick (Completed)      Meds ordered this encounter  Medications  . cyclobenzaprine (FLEXERIL) 10 MG tablet    Sig: Take 1 tablet (10 mg total) by mouth 3 (three) times daily.    Dispense:  30 tablet    Refill:  3  . pantoprazole (PROTONIX) 40 MG tablet    Sig: Take 1 tablet (40 mg total) by mouth 2 (two) times daily before a meal.    Dispense:  60 tablet    Refill:  1    Follow-up: Return in about 1 month (around 05/01/2019).    Kallie LocksNatalie M Marki Frede, FNP

## 2019-04-01 NOTE — Telephone Encounter (Signed)
New letter generated with 50 lbs listed. This was sent to her via [employer]. If she cannot pull it up, please leave a copy at the front desk for her. Richardson Dopp, PA-C    04/01/2019 5:05 PM

## 2019-04-02 ENCOUNTER — Telehealth: Payer: Self-pay | Admitting: *Deleted

## 2019-04-02 NOTE — Telephone Encounter (Signed)
  Mother is requesting that we put letter in the mail for her if possible. Same address

## 2019-04-02 NOTE — Telephone Encounter (Signed)
Spoke with patient mother Oleta Mouse to make sure dtr receives message that she able to come pick up work letter

## 2019-04-15 MED FILL — ?PANTOPRAZOLE SO DR 40MG TA: 40 | 30 days supply | Qty: 30 | Fill #1

## 2019-04-15 MED FILL — CLOPIDOGREL 75 MG TABLET: 75 | 31 days supply | Qty: 34 | Fill #1

## 2019-04-15 MED FILL — METOPROLOL TARTRATE 25 MG T: 25 | 30 days supply | Qty: 60 | Fill #1

## 2019-04-15 MED FILL — ?ATORVASTATIN 40MG TABLET: 40 | 30 days supply | Qty: 60 | Fill #1

## 2019-04-17 ENCOUNTER — Ambulatory Visit: Payer: Self-pay

## 2019-05-01 ENCOUNTER — Ambulatory Visit: Payer: Self-pay | Admitting: Family Medicine

## 2019-05-15 ENCOUNTER — Telehealth: Payer: Self-pay | Admitting: *Deleted

## 2019-05-15 NOTE — Telephone Encounter (Signed)
lvm pt's upcoming appt with Richardson Dopp, PA has to move due to pa's schedule changing.  Not sure if pt only wants VT visit.  If only VT visit needs to move to a Friday.

## 2019-05-20 MED FILL — ?METOPROLOL 25 MG TABLET: 25 | 30 days supply | Qty: 60 | Fill #2

## 2019-05-20 MED FILL — ?PANTOPRAZOLE SO DR 40MG TA: 40 | 30 days supply | Qty: 30 | Fill #2

## 2019-05-20 MED FILL — CLOPIDOGREL 75 MG TABLET: 75 | 30 days supply | Qty: 30 | Fill #2

## 2019-05-20 MED FILL — ?ATORVASTATIN 40MG TABLET: 40 | 30 days supply | Qty: 60 | Fill #2

## 2019-05-22 MED FILL — VALACYCLOVIR HCL 500 MG TAB: 500 | 60 days supply | Qty: 60 | Fill #1

## 2019-05-28 ENCOUNTER — Telehealth: Payer: Self-pay | Admitting: *Deleted

## 2019-05-28 NOTE — Telephone Encounter (Signed)
lvm for pt's upcoming appt with Richardson Dopp, PA on Sept 2 pt to call office to r/s and if pt wants a VT visit or in office visit.

## 2019-06-12 ENCOUNTER — Telehealth: Payer: Self-pay | Admitting: Physician Assistant

## 2019-06-13 MED FILL — ?METOPROLOL 25 MG TABLET: 25 | 30 days supply | Qty: 60 | Fill #3

## 2019-06-13 MED FILL — ?PANTOPRAZOLE SODI DR 40MGT: 40 | 30 days supply | Qty: 30 | Fill #3

## 2019-06-13 MED FILL — ?ATORVASTATIN 40MG TABLET: 40 | 30 days supply | Qty: 60 | Fill #3

## 2019-06-13 MED FILL — CLOPIDOGREL 75 MG TABLET: 75 | 30 days supply | Qty: 30 | Fill #3

## 2019-06-14 ENCOUNTER — Other Ambulatory Visit: Payer: Self-pay | Admitting: *Deleted

## 2019-06-14 DIAGNOSIS — Z20822 Contact with and (suspected) exposure to covid-19: Secondary | ICD-10-CM

## 2019-06-16 LAB — NOVEL CORONAVIRUS, NAA: SARS-CoV-2, NAA: NOT DETECTED

## 2019-06-24 MED FILL — ?PANTOPRAZOLE SODI DR 40MGT: 40 | 30 days supply | Qty: 30 | Fill #3

## 2019-06-24 MED FILL — METOPROLOL TARTRATE 25 MG T: 25 | 30 days supply | Qty: 60 | Fill #3

## 2019-06-24 MED FILL — ?ATORVASTATIN 40MG TABLET: 40 | 30 days supply | Qty: 60 | Fill #3

## 2019-06-24 MED FILL — CLOPIDOGREL 75 MG TABLET: 75 | 90 days supply | Qty: 90 | Fill #3

## 2019-07-03 ENCOUNTER — Ambulatory Visit (INDEPENDENT_AMBULATORY_CARE_PROVIDER_SITE_OTHER): Payer: Self-pay | Admitting: Physician Assistant

## 2019-07-03 ENCOUNTER — Other Ambulatory Visit: Payer: Self-pay

## 2019-07-03 ENCOUNTER — Encounter: Payer: Self-pay | Admitting: Physician Assistant

## 2019-07-03 VITALS — BP 122/78 | HR 76 | Ht 62.0 in | Wt 271.0 lb

## 2019-07-03 DIAGNOSIS — E782 Mixed hyperlipidemia: Secondary | ICD-10-CM

## 2019-07-03 DIAGNOSIS — I2510 Atherosclerotic heart disease of native coronary artery without angina pectoris: Secondary | ICD-10-CM

## 2019-07-03 DIAGNOSIS — I10 Essential (primary) hypertension: Secondary | ICD-10-CM

## 2019-07-03 DIAGNOSIS — M542 Cervicalgia: Secondary | ICD-10-CM

## 2019-07-03 NOTE — Progress Notes (Signed)
Cardiology Office Note:    Date:  07/03/2019   ID:  Erin Cantu, DOB 03/13/1966, MRN [id number]  PCP:  Kallie Locks, FNP  Cardiologist:  Tonny Bollman, MD  Electrophysiologist:  None   Referring MD: Kallie Locks, FNP   Chief Complaint  Patient presents with  . Follow-up    CAD    History of Present Illness:    Erin Cantu is a 53 y.o. female with:  Coronary artery disease  S/p inferior STEMI in 10/19 c/b VF arrest >> s/p POBA to OM2 (too small for stent)  Palpitations  Hyperlipidemia  Hypertension  Ms. Erin Cantu she was last seen in June 2020 via telemedicine.  She had a lot of issues with bruising.  I discussed her case with Dr. Excell Seltzer and we changed her Brilinta to Plavix.  She also complained of palpitations.  I tried to arrange a Zio patch monitor.  However, she does not have insurance and the company would not give her a discount.  She returns for follow-up.  She is here alone.  Since last seen, she has not had chest pain, significant shortness of breath, orthopnea, paroxysmal nocturnal dyspnea, syncope or leg swelling.  She does continue to have neck discomfort.  She also has discomfort in her left arm as well as weakness.  She has seen primary care.  Unfortunately, she does not have insurance coverage and she cannot get the [employer] because she lives in [place].  She has not had any further palpitations.  Prior CV studies:   The following studies were reviewed today:  Echo 08/02/18 EF 55-60, inf-lat and in HK, mild to mod MR, mild to mod LAE, RVSP 28, trivial TR  Cardiac catheterization 07/11/2018 LM normal LCx normal; OM 2 100-thrombotic RCA normal EF 55-65, inf HK PCI: POBA to the OM2 with residual 25 1. Widely patent/angiographically normal left main, RCA, and LAD 2. Total occlusion of the second OM branch of the circumflex, treated successful with balloon angioplasty (stenting not performed because of small vessel size and distal lesion  location) 3. Mild contraction abnormality of the left ventricle with preserved overall LVEF  Nuclear stress test5/18/2005 IMPRESSION  No convincing evidence of myocardial ischemia or infarction. The computer generated polar map suggests anterior reversibility, not seen on the images. Wall motion is normal throughout and the QGSEF measures 67 percent.  Past Medical History:  Diagnosis Date  . CAD (coronary artery disease), native coronary artery    a. 07/2018 - total occlusion of the second OM with balloon angioplasty   . Cardiac arrest (HCC) 07/11/2018  . Echocardiogram    Echo 10/19: EF 55-60, inferolateral and inferior hypokinesis, mild to moderate mitral regurgitation, mild to moderate LAE, normal RVSF, trivial TR  . Herpes   . HLD (hyperlipidemia)   . Hyperlipidemia   . Hypertension   . STEMI (ST elevation myocardial infarction) (HCC) 07/2018  . Ventricular fibrillation [facility])    Surgical Hx: The patient  has a past surgical history that includes Abdominal hysterectomy (09/2005); LEFT HEART CATH AND CORONARY ANGIOGRAPHY (N/A, 07/11/2018); and CORONARY BALLOON ANGIOPLASTY (N/A, 07/11/2018).   Current Medications: Current Meds  Medication Sig  . atorvastatin (LIPITOR) 80 MG tablet Take 1 tablet (80 mg total) by mouth daily at 6 PM.  . clopidogrel (PLAVIX) 75 MG tablet Take 4 tabs (300 mg) once.  Then, on the next day, start 1 tab (75 mg) once daily  . cyclobenzaprine (FLEXERIL) 10 MG tablet Take 1 tablet (10 mg  total) by mouth 3 (three) times daily.  . metoprolol tartrate (LOPRESSOR) 25 MG tablet Take 1 tablet (25 mg total) by mouth 2 (two) times daily.  . nitroGLYCERIN (NITROSTAT) 0.4 MG SL tablet Place 1 tablet (0.4 mg total) under the tongue every 5 (five) minutes x 3 doses as needed for chest pain.  . pantoprazole (PROTONIX) 40 MG tablet Take 1 tablet (40 mg total) by mouth 2 (two) times daily before a meal.  . [DISCONTINUED] aspirin EC 81 MG EC tablet Take 1 tablet (81 mg  total) by mouth daily.     Allergies:   Patient has no known allergies.   Social History   Tobacco Use  . Smoking status: Never Smoker  . Smokeless tobacco: Never Used  Substance Use Topics  . Alcohol use: No    Frequency: Never  . Drug use: No     Family Hx: The patient's family history includes COPD in her father; Cancer in her brother and paternal grandmother; Colon cancer in her mother; Diabetes in her maternal grandmother; Proteinuria in her daughter; Stroke in her mother.  ROS:   Please see the history of present illness.    Review of Systems  Musculoskeletal: Positive for neck pain.  Gastrointestinal: Negative for hematochezia.  Genitourinary: Negative for hematuria.   All other systems reviewed and are negative.   EKGs/Labs/Other Test Reviewed:    EKG:  EKG is not ordered today.  The ekg ordered today demonstrates N/A  Recent Labs: 07/24/2018: BUN 9; Creatinine, Ser 0.91; Hemoglobin 13.7; Platelets 311; Potassium 4.1; Sodium 142 09/03/2018: ALT 23 01/16/2019: TSH 1.150   Recent Lipid Panel Lab Results  Component Value Date/Time   CHOL 107 09/05/2018 08:01 AM   TRIG 53 09/05/2018 08:01 AM   HDL 45 09/05/2018 08:01 AM   CHOLHDL 2.4 09/05/2018 08:01 AM   CHOLHDL 3.4 07/12/2018 06:34 AM   LDLCALC 51 09/05/2018 08:01 AM    Physical Exam:    VS:  BP 122/78   Pulse 76   Ht 5\' 2"  (1.575 m)   Wt 271 lb (122.9 kg)   SpO2 97%   BMI 49.57 kg/m     Wt Readings from Last 3 Encounters:  07/03/19 271 lb (122.9 kg)  04/01/19 263 lb (119.3 kg)  03/12/19 253 lb (114.8 kg)     Physical Exam  Constitutional: She is oriented to person, place, and time. She appears well-developed and well-nourished. No distress.  HENT:  Head: Normocephalic and atraumatic.  Eyes: No scleral icterus.  Neck: Neck supple. No JVD present. No thyromegaly present.  Cardiovascular: Normal rate, regular rhythm, S1 normal, S2 normal and normal heart sounds.  No murmur heard.  Pulmonary/Chest: Effort normal and breath sounds normal. She has no rales.  Abdominal: Soft. There is no hepatomegaly.  Musculoskeletal:        General: No edema.  Lymphadenopathy:    She has no cervical adenopathy.  Neurological: She is alert and oriented to person, place, and time.  Skin: Skin is warm and dry.  Psychiatric: She has a normal mood and affect.    ASSESSMENT & PLAN:    1. Coronary artery disease involving native coronary artery of native heart without angina pectoris Hx of inferior STEMI c/b VF arrest tx with POBA to the OM2.  She had no significant dz elsewhere.  She is doing well without angina.  As she is 1 year out from her myocardial infarction, I think she can now stop aspirin and remain on clopidogrel.  Continue high intensity statin and beta-blocker therapy.  Follow-up with Dr. Excell Seltzer or me in 1 year.  2. Essential hypertension The patient's blood pressure is controlled on her current regimen.  Continue current therapy.   3. Mixed hyperlipidemia LDL optimal on most recent lab work.  Continue current Rx.    4. Neck pain Question if she has cervical disc disease causing her neck and arm discomfort.  She may be able to see 1 of the orthopedic surgeons through [facility].  She can discuss with her primary care provider at her next visit in October.   Dispo:  Return in about 1 year (around 07/02/2020) for Routine Follow Up, w/ Dr. Excell Seltzer, or Tereso Newcomer, PA-C.   Medication Adjustments/Labs and Tests Ordered: Current medicines are reviewed at length with the patient today.  Concerns regarding medicines are outlined above.  Tests Ordered: No orders of the defined types were placed in this encounter.  Medication Changes: No orders of the defined types were placed in this encounter.   Signed, Tereso Newcomer, PA-C  07/03/2019 4:45 PM    [facility] [address] [place], [place], [place]  [address] Phone: [phone]; Fax: [phone]

## 2019-07-03 NOTE — Patient Instructions (Signed)
Medication Instructions:  STOP Aspirin on 07/11/2019 If you need a refill on your cardiac medications before your next appointment, please call your pharmacy.   Lab work: none If you have labs (blood work) drawn today and your tests are completely normal, you will receive your results only by: Marland Kitchen MyChart Message (if you have MyChart) OR . A paper copy in the mail If you have any lab test that is abnormal or we need to change your treatment, we will call you to review the results.  Testing/Procedures: none  Follow-Up: At [facility], you and your health needs are our priority.  As part of our continuing mission to provide you with exceptional heart care, we have created designated Provider Care Teams.  These Care Teams include your primary Cardiologist (physician) and Advanced Practice Providers (APPs -  Physician Assistants and Nurse Practitioners) who all work together to provide you with the care you need, when you need it. You will need a follow up appointment in:  1 years.  Please call our office 2 months in advance to schedule this appointment.  You may see Sherren Mocha, MD or one of the following Advanced Practice Providers on your designated Care Team: Richardson Dopp, PA-C [place], [place] . Daune Perch, NP  Any Other Special Instructions Will Be Listed Below (If Applicable).

## 2019-07-15 ENCOUNTER — Other Ambulatory Visit: Payer: Self-pay

## 2019-07-15 ENCOUNTER — Encounter: Payer: Self-pay | Admitting: Family Medicine

## 2019-07-15 ENCOUNTER — Ambulatory Visit (HOSPITAL_COMMUNITY)
Admission: RE | Admit: 2019-07-15 | Discharge: 2019-07-15 | Disposition: A | Discharge status: Home / Self Care | Payer: Self-pay | Source: Ambulatory Visit | Attending: Family Medicine | Admitting: Family Medicine

## 2019-07-15 ENCOUNTER — Ambulatory Visit (INDEPENDENT_AMBULATORY_CARE_PROVIDER_SITE_OTHER): Payer: Self-pay | Admitting: Family Medicine

## 2019-07-15 VITALS — BP 121/67 | HR 75 | Temp 98.1°F | Ht 62.0 in | Wt 271.2 lb

## 2019-07-15 DIAGNOSIS — G8929 Other chronic pain: Secondary | ICD-10-CM

## 2019-07-15 DIAGNOSIS — K219 Gastro-esophageal reflux disease without esophagitis: Secondary | ICD-10-CM

## 2019-07-15 DIAGNOSIS — M25512 Pain in left shoulder: Secondary | ICD-10-CM

## 2019-07-15 DIAGNOSIS — I10 Essential (primary) hypertension: Secondary | ICD-10-CM

## 2019-07-15 DIAGNOSIS — Z09 Encounter for follow-up examination after completed treatment for conditions other than malignant neoplasm: Secondary | ICD-10-CM

## 2019-07-15 LAB — POCT URINALYSIS DIPSTICK
Bilirubin, UA: NEGATIVE
Blood, UA: NEGATIVE
Glucose, UA: NEGATIVE
Leukocytes, UA: NEGATIVE
Nitrite, UA: NEGATIVE
Protein, UA: POSITIVE — AB
Spec Grav, UA: >=1.03 — AB (ref 1.010–1.025)
Urobilinogen, UA: 1 E.U./dL
pH, UA: 5.5 (ref 5.0–8.0)

## 2019-07-15 MED ORDER — NAPROXEN 500 MG PO TABS: 500.0000 mg | ORAL_TABLET | Freq: Two times a day (BID) | ORAL | 3 refills | Status: DC

## 2019-07-15 MED ORDER — KETOROLAC TROMETHAMINE 30 MG/ML IJ SOLN
30.0000 mg | Freq: Once | INTRAMUSCULAR | Status: AC
  Administered 2019-07-15: 30 mg via INTRAMUSCULAR

## 2019-07-15 MED FILL — ?NAPROXEN 500 MG TABS: 500 | 30 days supply | Qty: 60 | Fill #0

## 2019-07-15 NOTE — Patient Instructions (Addendum)
Naproxen and naproxen sodium oral immediate-release tablets What is this medicine? NAPROXEN (na PROX en) is a non-steroidal anti-inflammatory drug (NSAID). It is used to reduce swelling and to treat pain. This medicine may be used for dental pain, headache, or painful monthly periods. It is also used for painful joint and muscular problems such as arthritis, tendinitis, bursitis, and gout. This medicine may be used for other purposes; ask your health care provider or pharmacist if you have questions. COMMON BRAND NAME(S): Aflaxen, Aleve, Aleve Arthritis, All Day Relief, Anaprox, Anaprox DS, Naprosyn, [facility] Naproxen Sodium What should I tell my health care provider before I take this medicine? They need to know if you have any of these conditions:  cigarette smoker  coronary artery bypass graft (CABG) surgery within the past 2 weeks  drink more than 3 alcohol-containing drinks a day  heart disease  high blood pressure  history of stomach bleeding  kidney disease  liver disease  lung or breathing disease, like asthma  an unusual or allergic reaction to naproxen, aspirin, other NSAIDs, other medicines, foods, dyes, or preservatives  pregnant or trying to get pregnant  breast-feeding How should I use this medicine? Take this medicine by mouth with a glass of water. Follow the directions on the prescription label. Take it with food if your stomach gets upset. Try to not lie down for at least 10 minutes after you take it. Take your medicine at regular intervals. Do not take your medicine more often than directed. Long-term, continuous use may increase the risk of heart attack or stroke. A special MedGuide will be given to you by the pharmacist with each prescription and refill. Be sure to read this information carefully each time. Talk to your pediatrician regarding the use of this medicine in children. Special care may be needed. Overdosage: If you think you have taken too much of  this medicine contact a poison control center or emergency room at once. NOTE: This medicine is only for you. Do not share this medicine with others. What if I miss a dose? If you miss a dose, take it as soon as you can. If it is almost time for your next dose, take only that dose. Do not take double or extra doses. What may interact with this medicine?  alcohol  aspirin  cidofovir  diuretics  lithium  methotrexate  other drugs for inflammation like ketorolac or prednisone  pemetrexed  probenecid  warfarin This list may not describe all possible interactions. Give your health care provider a list of all the medicines, herbs, non-prescription drugs, or dietary supplements you use. Also tell them if you smoke, drink alcohol, or use illegal drugs. Some items may interact with your medicine. What should I watch for while using this medicine? Tell your doctor or healthcare provider if your pain does not get better. Talk to your doctor before taking another medicine for pain. Do not treat yourself. This medicine does not prevent heart attack or stroke. In fact, this medicine may increase the chance of a heart attack or stroke. The chance may increase with longer use of this medicine and in people who have heart disease. If you take aspirin to prevent heart attack or stroke, talk with your doctor or healthcare provider. This medicine may cause serious skin reactions. They can happen weeks to months after starting the medicine. Contact your healthcare provider right away if you notice fevers or flu-like symptoms with a rash. The rash may be red or purple and then  turn into blisters or peeling of the skin. Or, you might notice a red rash with swelling of the face, lips or lymph nodes in your neck or under your arms. Do not take other medicines that contain aspirin, ibuprofen, or naproxen with this medicine. Side effects such as stomach upset, nausea, or ulcers may be more likely to occur. Many  medicines available without a prescription should not be taken with this medicine. This medicine can cause ulcers and bleeding in the stomach and intestines at any time during treatment. Do not smoke cigarettes or drink alcohol. These increase irritation to your stomach and can make it more susceptible to damage from this medicine. Ulcers and bleeding can happen without warning symptoms and can cause death. You may get drowsy or dizzy. Do not drive, use machinery, or do anything that needs mental alertness until you know how this medicine affects you. Do not stand or sit up quickly, especially if you are an older patient. This reduces the risk of dizzy or fainting spells. This medicine can cause you to bleed more easily. Try to avoid damage to your teeth and gums when you brush or floss your teeth. What side effects may I notice from receiving this medicine? Side effects that you should report to your doctor or health care professional as soon as possible:  black or bloody stools, blood in the urine or vomit  blurred vision  chest pain  difficulty breathing or wheezing  nausea or vomiting  redness, blistering, peeling, or loosening of the skin, including inside the mouth  severe stomach pain  skin rash, hives, or itching  slurred speech or weakness on one side of the body  swelling of eyelids, throat, lips  unexplained weight gain or swelling  unusually weak or tired  yellowing of eyes or skin Side effects that usually do not require medical attention (report to your doctor or health care professional if they continue or are bothersome):  constipation  headache  heartburn This list may not describe all possible side effects. Call your doctor for medical advice about side effects. You may report side effects to FDA at 1-800-FDA-1088. Where should I keep my medicine? Keep out of the reach of children. Store at room temperature between 15 and 30 degrees C (59 and 86 degrees F).  Keep container tightly closed. Throw away any unused medicine after the expiration date. NOTE: This sheet is a summary. It may not cover all possible information. If you have questions about this medicine, talk to your doctor, pharmacist, or health care provider.  2020 Elsevier/Gold Standard (2018-12-19 13:24:36) Muscle Strain A muscle strain is an injury that occurs when a muscle is stretched beyond its normal length. Usually, a small number of muscle fibers are torn when this happens. There are three types of muscle strains. First-degree strains have the least amount of muscle fiber tearing and the least amount of pain. Second-degree and third-degree strains have more tearing and pain. Usually, recovery from muscle strain takes 1-2 weeks. Complete healing normally takes 5-6 weeks. What are the causes? This condition is caused when a sudden, violent force is placed on a muscle and stretches it too far. This may occur with a fall, lifting, or sports. What increases the risk? This condition is more likely to develop in athletes and people who are physically active. What are the signs or symptoms? Symptoms of this condition include:  Pain.  Bruising.  Swelling.  Trouble using the muscle. How is this diagnosed? This condition  is diagnosed based on a physical exam and your medical history. Tests may also be done, including an X-ray, ultrasound, or MRI. How is this treated? This condition is initially treated with PRICE therapy. This therapy involves:  Protecting the muscle from being injured again.  Resting the injured muscle.  Icing the injured muscle.  Applying pressure (compression) to the injured muscle. This may be done with a splint or elastic bandage.  Raising (elevating) the injured muscle. Your health care provider may also recommend medicine for pain. Follow these instructions at home: If you have a splint:  Wear the splint as told by your health care provider. Remove it  only as told by your health care provider.  Loosen the splint if your fingers or toes tingle, become numb, or turn cold and blue.  Keep the splint clean.  If the splint is not waterproof: ? Do not let it get wet. ? Cover it with a watertight covering when you take a bath or a shower. Managing pain, stiffness, and swelling   If directed, put ice on the injured area. ? If you have a removable splint, remove it as told by your health care provider. ? Put ice in a plastic bag. ? Place a towel between your skin and the bag. ? Leave the ice on for 20 minutes, 2-3 times a day.  Move your fingers or toes often to avoid stiffness and to lessen swelling.  Raise (elevate) the injured area above the level of your heart while you are sitting or lying down.  Wear an elastic bandage as told by your health care provider. Make sure that it is not too tight. General instructions  Take over-the-counter and prescription medicines only as told by your health care provider.  Restrict your activity and rest the injured muscle as told by your health care provider. Gentle movements may be allowed.  If physical therapy was prescribed, do exercises as told by your health care provider.  Do not put pressure on any part of the splint until it is fully hardened. This may take several hours.  Do not use any products that contain nicotine or tobacco, such as cigarettes and e-cigarettes. These can delay bone healing. If you need help quitting, ask your health care provider.  Ask your health care provider when it is safe to drive if you have a splint.  Keep all follow-up visits as told by your health care provider. This is important. How is this prevented?  Warm up before exercising. This helps to prevent future muscle strains. Contact a health care provider if:  You have more pain or swelling in the injured area. Get help right away if:  You have numbness or tingling or lose a lot of strength in the  injured area. Summary  A muscle strain is an injury that occurs when a muscle is stretched beyond its normal length.  This condition is caused when a sudden, violent force is placed on a muscle and stretches it too far.  This condition is initially treated with PRICE therapy, which involves protecting, resting, icing, compressing, and elevating.  Gentle movements may be allowed. If physical therapy was prescribed, do exercises as told by your health care provider. This information is not intended to replace advice given to you by your health care provider. Make sure you discuss any questions you have with your health care provider. Document Released: 09/26/2005 Document Revised: 09/08/2017 Document Reviewed: 11/02/2016 Elsevier Patient Education  2020 [employer]

## 2019-07-15 NOTE — Progress Notes (Signed)
[facility]    Established Patient Office Visit  Subjective:  Patient ID: Erin Cantu, female    DOB: 1966-06-24  Age: 53 y.o. MRN: [id number]  CC:  Chief Complaint  Patient presents with  . Follow-up    Left shoulder & Left neck pain  . Hand Pain    cramping in both hands, pt ? potassium level    HPI Erin Cantu is a 53 year old female who presents for Follow Up today.   Past Medical History:  Diagnosis Date  . CAD (coronary artery disease), native coronary artery    a. 07/2018 - total occlusion of the second OM with balloon angioplasty   . Cardiac arrest (HCC) 07/11/2018  . Echocardiogram    Echo 10/19: EF 55-60, inferolateral and inferior hypokinesis, mild to moderate mitral regurgitation, mild to moderate LAE, normal RVSF, trivial TR  . Herpes   . HLD (hyperlipidemia)   . Hyperlipidemia   . Hypertension   . STEMI (ST elevation myocardial infarction) (HCC) 07/2018  . Ventricular fibrillation (HCC)    Current Status: Since her last office visit, she continues to have chronic pain r/t previous left shoulder muscle strain. She continues to have fever blisters in mouth area. She denies visual changes, chest pain, cough, shortness of breath, heart palpitations, and falls. She has occasional headaches and dizziness with position changes. Denies severe confusion, seizures, double vision, and blurred vision, nausea and vomiting. She denies fevers, chills, fatigue, recent infections, weight loss, and night sweats. No reports of GI problems such as diarrhea, and constipation. She has no reports of blood in stools, dysuria and hematuria. No depression or anxiety reported today.  She denies pain today.   Past Surgical History:  Procedure Laterality Date  . ABDOMINAL HYSTERECTOMY  09/2005   done for fibroids  . CORONARY BALLOON ANGIOPLASTY N/A 07/11/2018   Procedure: CORONARY BALLOON ANGIOPLASTY;  Surgeon: Tonny Bollman, MD;  Location:  [facility] INVASIVE CV LAB;  Service: Cardiovascular;  Laterality: N/A;  . LEFT HEART CATH AND CORONARY ANGIOGRAPHY N/A 07/11/2018   Procedure: LEFT HEART CATH AND CORONARY ANGIOGRAPHY;  Surgeon: Tonny Bollman, MD;  Location: [facility] INVASIVE CV LAB;  Service: Cardiovascular;  Laterality: N/A;    Family History  Problem Relation Age of Onset  . Cancer Paternal Grandmother        breast  . Diabetes Maternal Grandmother   . COPD Father   . Lung cancer Father   . Stroke Mother   . Colon cancer Mother   . Cancer Brother        metastatic lung cancer  . Proteinuria Daughter     Social History   Socioeconomic History  . Marital status: Divorced    Spouse name: Not on file  . Number of children: Not on file  . Years of education: Not on file  . Highest education level: Not on file  Occupational History  . Not on file  Social Needs  . Financial resource strain: Not on file  . Food insecurity    Worry: Not on file    Inability: Not on file  . Transportation needs    Medical: Not on file    Non-medical: Not on file  Tobacco Use  . Smoking status: Never Smoker  . Smokeless tobacco: Never Used  Substance and Sexual Activity  . Alcohol use: No    Frequency: Never  . Drug use: No  . Sexual activity: Not Currently    Birth  control/protection: Post-menopausal, Surgical    Comment: hysterectomy  Lifestyle  . Physical activity    Days per week: Not on file    Minutes per session: Not on file  . Stress: Not on file  Relationships  . Social Herbalist on phone: Not on file    Gets together: Not on file    Attends religious service: Not on file    Active member of club or organization: Not on file    Attends meetings of clubs or organizations: Not on file    Relationship status: Not on file  . Intimate partner violence    Fear of current or ex partner: Not on file    Emotionally abused: Not on file    Physically abused: Not on file    Forced sexual activity: Not on file   Other Topics Concern  . Not on file  Social History Narrative  . Not on file    Outpatient Medications Prior to Visit  Medication Sig Dispense Refill  . atorvastatin (LIPITOR) 80 MG tablet Take 1 tablet (80 mg total) by mouth daily at 6 PM. 30 tablet 6  . clopidogrel (PLAVIX) 75 MG tablet Take 4 tabs (300 mg) once.  Then, on the next day, start 1 tab (75 mg) once daily 90 tablet 3  . cyclobenzaprine (FLEXERIL) 10 MG tablet Take 1 tablet (10 mg total) by mouth 3 (three) times daily. 30 tablet 3  . metoprolol tartrate (LOPRESSOR) 25 MG tablet Take 1 tablet (25 mg total) by mouth 2 (two) times daily. 180 tablet 3  . nitroGLYCERIN (NITROSTAT) 0.4 MG SL tablet Place 1 tablet (0.4 mg total) under the tongue every 5 (five) minutes x 3 doses as needed for chest pain. 25 tablet 2  . pantoprazole (PROTONIX) 40 MG tablet Take 1 tablet (40 mg total) by mouth 2 (two) times daily before a meal. 60 tablet 1   No facility-administered medications prior to visit.     No Known Allergies  ROS Review of Systems  Constitutional: Negative.   HENT: Negative.   Eyes: Negative.   Respiratory: Negative.   Cardiovascular: Negative.   Gastrointestinal: Negative.   Endocrine: Negative.   Genitourinary: Negative.   Musculoskeletal: Negative.   Skin: Negative.   Allergic/Immunologic: Negative.   Neurological: Positive for dizziness and headaches.  Hematological: Negative.   Psychiatric/Behavioral: Positive for agitation.      Objective:    Physical Exam  Constitutional: She is oriented to person, place, and time. She appears well-developed and well-nourished.  HENT:  Head: Normocephalic and atraumatic.  Eyes: Conjunctivae are normal.  Neck: Normal range of motion. Neck supple.  Cardiovascular: Normal rate, regular rhythm, normal heart sounds and intact distal pulses.  Pulmonary/Chest: Effort normal and breath sounds normal.  Abdominal: Soft. Bowel sounds are normal.  Musculoskeletal: Normal range  of motion.  Neurological: She is alert and oriented to person, place, and time. She has normal reflexes.  Skin: Skin is warm and dry.  Psychiatric: She has a normal mood and affect. Her behavior is normal. Judgment and thought content normal.  Nursing note and vitals reviewed.   BP 121/67 (BP Location: Right Arm, Patient Position: Sitting, Cuff Size: Large)   Pulse 75   Temp 98.1 F (36.7 C) (Oral)   Ht 5\' 2"  (1.575 m)   Wt 271 lb 3.2 oz (123 kg)   SpO2 99%   BMI 49.60 kg/m  Wt Readings from Last 3 Encounters:  07/15/19 271 lb 3.2  oz (123 kg)  07/03/19 271 lb (122.9 kg)  04/01/19 263 lb (119.3 kg)     Health Maintenance Due  Topic Date Due  . MAMMOGRAM  02/23/2018  . COLONOSCOPY  02/23/2018  . INFLUENZA VACCINE  05/11/2019    There are no preventive care reminders to display for this patient.  Lab Results  Component Value Date   TSH 0.733 07/15/2019   Lab Results  Component Value Date   WBC 5.6 07/15/2019   HGB 13.4 07/15/2019   HCT 40.7 07/15/2019   MCV 89 07/15/2019   PLT 238 07/15/2019   Lab Results  Component Value Date   NA 141 07/15/2019   K 4.0 07/15/2019   CO2 25 07/15/2019   GLUCOSE 114 (H) 07/15/2019   BUN 8 07/15/2019   CREATININE 0.86 07/15/2019   BILITOT 0.4 07/15/2019   ALKPHOS 152 (H) 07/15/2019   AST 19 07/15/2019   ALT 22 07/15/2019   PROT 6.6 07/15/2019   ALBUMIN 4.0 07/15/2019   CALCIUM 9.5 07/15/2019   ANIONGAP 7 07/12/2018   Lab Results  Component Value Date   CHOL 107 07/15/2019   Lab Results  Component Value Date   HDL 49 07/15/2019   Lab Results  Component Value Date   LDLCALC 45 07/15/2019   Lab Results  Component Value Date   TRIG 57 07/15/2019   Lab Results  Component Value Date   CHOLHDL 2.2 07/15/2019   Lab Results  Component Value Date   HGBA1C 5.4 07/24/2018      Assessment & Plan:   1. Essential hypertension The current medical regimen is effective; blood pressure is stable at 12/67 today;  continue present plan and medications as prescribed. She will continue to take medications as prescribed, to decrease high sodium intake, excessive alcohol intake, increase potassium intake, smoking cessation, and increase physical activity of at least 30 minutes of cardio activity daily. She will continue to follow Heart Healthy or DASH diet. - POCT urinalysis dipstick - CBC with Differential - Comprehensive metabolic panel - TSH - Lipid Panel - Vitamin B12 - Vitamin D, 25-hydroxy  2. Chronic left shoulder pain - DG Shoulder Left; Future - naproxen (NAPROSYN) 500 MG tablet; Take 1 tablet (500 mg total) by mouth 2 (two) times daily with a meal.  Dispense: 60 tablet; Refill: 3 - ketorolac (TORADOL) 30 MG/ML injection 30 mg  3. Gastroesophageal reflux disease without esophagitis  4. Follow up She will follow up in 3 months.   Meds ordered this encounter  Medications  . naproxen (NAPROSYN) 500 MG tablet    Sig: Take 1 tablet (500 mg total) by mouth 2 (two) times daily with a meal.    Dispense:  60 tablet    Refill:  3  . ketorolac (TORADOL) 30 MG/ML injection 30 mg    Orders Placed This Encounter  Procedures  . DG Shoulder Left  . CBC with Differential  . Comprehensive metabolic panel  . TSH  . Lipid Panel  . Vitamin B12  . Vitamin D, 25-hydroxy  . POCT urinalysis dipstick    Referral Orders  No referral(s) requested today    Raliegh Ip,  MSN, FNP-BC [facility] [address] , [place] [address] [phone] fax  Meds ordered this encounter  Medications  . naproxen (NAPROSYN) 500 MG tablet    Sig: Take 1 tablet (500 mg total) by mouth 2 (two) times daily with a meal.  Dispense:  60 tablet    Refill:  3  . ketorolac (TORADOL) 30 MG/ML injection 30 mg   Problem List Items Addressed This Visit      Cardiovascular and Mediastinum   Hypertension - Primary   Relevant  Orders   POCT urinalysis dipstick (Completed)   CBC with Differential (Completed)   Comprehensive metabolic panel (Completed)   TSH (Completed)   Lipid Panel (Completed)   Vitamin B12 (Completed)   Vitamin D, 25-hydroxy (Completed)     Digestive   Gastroesophageal reflux disease without esophagitis    Other Visit Diagnoses    Chronic left shoulder pain       Relevant Medications   naproxen (NAPROSYN) 500 MG tablet   ketorolac (TORADOL) 30 MG/ML injection 30 mg (Completed)   Other Relevant Orders   DG Shoulder Left (Completed)   Follow up          Meds ordered this encounter  Medications  . naproxen (NAPROSYN) 500 MG tablet    Sig: Take 1 tablet (500 mg total) by mouth 2 (two) times daily with a meal.    Dispense:  60 tablet    Refill:  3  . ketorolac (TORADOL) 30 MG/ML injection 30 mg    Follow-up: Return in about 3 months (around 10/15/2019).    Kallie LocksNatalie M Somtochukwu Woollard, FNP

## 2019-07-16 DIAGNOSIS — G8929 Other chronic pain: Secondary | ICD-10-CM | POA: Insufficient documentation

## 2019-07-16 LAB — COMPREHENSIVE METABOLIC PANEL
ALT: 22 IU/L (ref 0–32)
AST: 19 IU/L (ref 0–40)
Albumin/Globulin Ratio: 1.5 (ref 1.2–2.2)
Albumin: 4 g/dL (ref 3.8–4.9)
Alkaline Phosphatase: 152 IU/L — ABNORMAL HIGH (ref 39–117)
BUN/Creatinine Ratio: 9 (ref 9–23)
BUN: 8 mg/dL (ref 6–24)
Bilirubin Total: 0.4 mg/dL (ref 0.0–1.2)
CO2: 25 mmol/L (ref 20–29)
Calcium: 9.5 mg/dL (ref 8.7–10.2)
Chloride: 105 mmol/L (ref 96–106)
Creatinine, Ser: 0.86 mg/dL (ref 0.57–1.00)
GFR calc Af Amer: 90 mL/min/{1.73_m2} (ref >59)
GFR calc non Af Amer: 78 mL/min/{1.73_m2} (ref >59)
Globulin, Total: 2.6 g/dL (ref 1.5–4.5)
Glucose: 114 mg/dL — ABNORMAL HIGH (ref 65–99)
Potassium: 4 mmol/L (ref 3.5–5.2)
Sodium: 141 mmol/L (ref 134–144)
Total Protein: 6.6 g/dL (ref 6.0–8.5)

## 2019-07-16 LAB — CBC WITH DIFFERENTIAL/PLATELET
Basophils Absolute: 0 10*3/uL (ref 0.0–0.2)
Basos: 1 %
EOS (ABSOLUTE): 0.1 10*3/uL (ref 0.0–0.4)
Eos: 2 %
Hematocrit: 40.7 % (ref 34.0–46.6)
Hemoglobin: 13.4 g/dL (ref 11.1–15.9)
Immature Grans (Abs): 0 10*3/uL (ref 0.0–0.1)
Immature Granulocytes: 0 %
Lymphocytes Absolute: 1.8 10*3/uL (ref 0.7–3.1)
Lymphs: 31 %
MCH: 29.3 pg (ref 26.6–33.0)
MCHC: 32.9 g/dL (ref 31.5–35.7)
MCV: 89 fL (ref 79–97)
Monocytes Absolute: 0.5 10*3/uL (ref 0.1–0.9)
Monocytes: 8 %
Neutrophils Absolute: 3.2 10*3/uL (ref 1.4–7.0)
Neutrophils: 58 %
Platelets: 238 10*3/uL (ref 150–450)
RBC: 4.57 x10E6/uL (ref 3.77–5.28)
RDW: 13.3 % (ref 11.7–15.4)
WBC: 5.6 10*3/uL (ref 3.4–10.8)

## 2019-07-16 LAB — LIPID PANEL
Chol/HDL Ratio: 2.2 ratio (ref 0.0–4.4)
Cholesterol, Total: 107 mg/dL (ref 100–199)
HDL: 49 mg/dL (ref >39)
LDL Chol Calc (NIH): 45 mg/dL (ref 0–99)
Triglycerides: 57 mg/dL (ref 0–149)
VLDL Cholesterol Cal: 13 mg/dL (ref 5–40)

## 2019-07-16 LAB — TSH: TSH: 0.733 u[IU]/mL (ref 0.450–4.500)

## 2019-07-16 LAB — VITAMIN B12: Vitamin B-12: 372 pg/mL (ref 232–1245)

## 2019-07-16 LAB — VITAMIN D 25 HYDROXY (VIT D DEFICIENCY, FRACTURES): Vit D, 25-Hydroxy: 42 ng/mL (ref 30.0–100.0)

## 2019-07-19 ENCOUNTER — Other Ambulatory Visit: Payer: Self-pay | Admitting: Family Medicine

## 2019-07-19 DIAGNOSIS — M25512 Pain in left shoulder: Secondary | ICD-10-CM

## 2019-07-19 DIAGNOSIS — G8929 Other chronic pain: Secondary | ICD-10-CM

## 2019-07-19 MED ORDER — DICLOFENAC SODIUM 1 % TD GEL: 2.0000 g | Freq: Four times a day (QID) | TRANSDERMAL | 3 refills | Status: DC

## 2019-07-19 MED ORDER — ACETAMINOPHEN 500 MG PO TABS: 500.0000 mg | ORAL_TABLET | Freq: Four times a day (QID) | ORAL | 3 refills | Status: AC | PRN

## 2019-07-23 ENCOUNTER — Other Ambulatory Visit: Payer: Self-pay | Admitting: Family Medicine

## 2019-07-23 DIAGNOSIS — B009 Herpesviral infection, unspecified: Secondary | ICD-10-CM

## 2019-07-23 MED FILL — PANTOPRAZOLE SOD DR 40 MG T: 40 | 30 days supply | Qty: 30 | Fill #4

## 2019-07-23 MED FILL — ?ATORVASTATIN 40MG TABLET: 40 | 30 days supply | Qty: 60 | Fill #4

## 2019-07-23 MED FILL — VALACYCLOVIR HCL 500 MG TAB: 500 | 30 days supply | Qty: 30 | Fill #0

## 2019-08-22 ENCOUNTER — Other Ambulatory Visit: Payer: Self-pay | Admitting: Family Medicine

## 2019-08-22 MED FILL — ?ATORVASTATIN 40MG TABLET: 40 | 30 days supply | Qty: 60 | Fill #0

## 2019-08-22 MED FILL — VALACYCLOVIR HCL 500 MG TAB: 500 | 30 days supply | Qty: 30 | Fill #1

## 2019-08-22 MED FILL — PANTOPRAZOLE SOD DR 40 MG T: 40 | 30 days supply | Qty: 30 | Fill #5

## 2019-09-18 ENCOUNTER — Other Ambulatory Visit: Payer: Self-pay | Admitting: Family Medicine

## 2019-09-18 MED FILL — PANTOPRAZOLE SOD DR 40 MG T: 40 | 30 days supply | Qty: 30 | Fill #6

## 2019-09-18 MED FILL — CLOPIDOGREL 75 MG TABLET: 75 | 90 days supply | Qty: 90 | Fill #4

## 2019-09-18 MED FILL — ?ATORVASTATIN 40MG TABLET: 40 | 30 days supply | Qty: 60 | Fill #0

## 2019-09-18 MED FILL — ?METOPROLOL 25 MG TABLET: 25 | 30 days supply | Qty: 60 | Fill #3

## 2019-09-18 MED FILL — VALACYCLOVIR HCL 500 MG TAB: 500 | 30 days supply | Qty: 30 | Fill #2

## 2019-10-02 ENCOUNTER — Other Ambulatory Visit: Payer: Self-pay

## 2019-10-02 ENCOUNTER — Ambulatory Visit: Payer: BC Managed Care – PPO | Attending: Internal Medicine

## 2019-10-02 DIAGNOSIS — Z20822 Contact with and (suspected) exposure to covid-19: Secondary | ICD-10-CM

## 2019-10-02 DIAGNOSIS — U071 COVID-19: Secondary | ICD-10-CM | POA: Insufficient documentation

## 2019-10-03 LAB — NOVEL CORONAVIRUS, NAA: SARS-CoV-2, NAA: DETECTED — AB

## 2019-10-04 ENCOUNTER — Other Ambulatory Visit: Payer: Self-pay

## 2019-10-04 ENCOUNTER — Encounter (HOSPITAL_COMMUNITY): Payer: Self-pay

## 2019-10-04 ENCOUNTER — Emergency Department (HOSPITAL_COMMUNITY)
Admission: EM | Admit: 2019-10-04 | Discharge: 2019-10-04 | Disposition: A | Discharge status: Home / Self Care | Payer: Self-pay | Attending: Emergency Medicine | Admitting: Emergency Medicine

## 2019-10-04 DIAGNOSIS — Z79899 Other long term (current) drug therapy: Secondary | ICD-10-CM | POA: Insufficient documentation

## 2019-10-04 DIAGNOSIS — I2510 Atherosclerotic heart disease of native coronary artery without angina pectoris: Secondary | ICD-10-CM | POA: Insufficient documentation

## 2019-10-04 DIAGNOSIS — U071 COVID-19: Secondary | ICD-10-CM | POA: Insufficient documentation

## 2019-10-04 DIAGNOSIS — I10 Essential (primary) hypertension: Secondary | ICD-10-CM | POA: Insufficient documentation

## 2019-10-04 LAB — URINALYSIS, ROUTINE W REFLEX MICROSCOPIC
Bilirubin Urine: NEGATIVE
Glucose, UA: NEGATIVE mg/dL
Hgb urine dipstick: NEGATIVE
Ketones, ur: NEGATIVE mg/dL
Leukocytes,Ua: NEGATIVE
Nitrite: NEGATIVE
Protein, ur: NEGATIVE mg/dL
Specific Gravity, Urine: 1.01 (ref 1.005–1.030)
pH: 6 (ref 5.0–8.0)

## 2019-10-04 NOTE — Discharge Instructions (Addendum)
Make sure you are getting plenty of rest, drinking a lot of fluids and try to eat 3 meals a day.  For fever use Tylenol or Motrin.  For cough use Robitussin-DM.  See your doctor or return here if you feel like you are having trouble breathing, or have other questions or concerns.  You will likely be sick another 1 to 2 weeks.  Make sure you self quarantine away from everyone who does not have COVID-19 infection, currently.

## 2019-10-04 NOTE — ED Provider Notes (Signed)
[facility] EMERGENCY DEPARTMENT Provider Note   CSN: [id number] Arrival date & time: 10/04/19  5170     History Chief Complaint  Patient presents with  . Fever    Covid +    Erin Cantu is a 53 y.o. female.  HPI Patient presents for evaluation of fever.  She checked it at home and it was 104, around midnight.  She checked it shortly after that and it was 98.  She called a hospital nurse who advised her to come to the hospital, "for an accurate temperature."  She also complains of low back pain, on and off since early yesterday morning.  She is worried that she has a urinary tract infection.  She denies dysuria, urinary frequency or hematuria.  She has been sick about a week with nausea, abdominal pain, and decreased appetite.  She denies shortness of breath, cough, fever earlier than tonight, or other problems or concerns.  She had a Covid test done, 2 days ago, after she found out that both of her parents were positive for it.  She works from home.  She has been taking her usual medications.  There are no other known modifying factors.    Past Medical History:  Diagnosis Date  . CAD (coronary artery disease), native coronary artery    a. 07/2018 - total occlusion of the second OM with balloon angioplasty   . Cardiac arrest (HCC) 07/11/2018  . Echocardiogram    Echo 10/19: EF 55-60, inferolateral and inferior hypokinesis, mild to moderate mitral regurgitation, mild to moderate LAE, normal RVSF, trivial TR  . Herpes   . HLD (hyperlipidemia)   . Hyperlipidemia   . Hypertension   . STEMI (ST elevation myocardial infarction) (HCC) 07/2018  . Ventricular fibrillation [facility])     Patient Active Problem List   Diagnosis Date Noted  . Chronic left shoulder pain 07/16/2019  . Coronary artery disease involving native coronary artery of native heart without angina pectoris 03/11/2019  . History of ST elevation myocardial infarction (STEMI) 01/13/2019  . Heart palpitations 01/13/2019  .  Gastroesophageal reflux disease without esophagitis 01/13/2019  . Herpes 01/13/2019  . Hyperlipidemia 07/13/2018  . Hypertension 07/13/2018  . Hx of Inf STEMI in 07/2018 07/11/2018    Past Surgical History:  Procedure Laterality Date  . ABDOMINAL HYSTERECTOMY  09/2005   done for fibroids  . CORONARY BALLOON ANGIOPLASTY N/A 07/11/2018   Procedure: CORONARY BALLOON ANGIOPLASTY;  Surgeon: Tonny Bollman, MD;  Location: [facility] INVASIVE CV LAB;  Service: Cardiovascular;  Laterality: N/A;  . LEFT HEART CATH AND CORONARY ANGIOGRAPHY N/A 07/11/2018   Procedure: LEFT HEART CATH AND CORONARY ANGIOGRAPHY;  Surgeon: Tonny Bollman, MD;  Location: [facility] INVASIVE CV LAB;  Service: Cardiovascular;  Laterality: N/A;     OB History    Gravida  2   Para  1   Term  1   Preterm      AB  1   Living  1     SAB  1   TAB      Ectopic      Multiple      Live Births              Family History  Problem Relation Age of Onset  . Cancer Paternal Grandmother        breast  . Diabetes Maternal Grandmother   . COPD Father   . Lung cancer Father   . Stroke Mother   . Colon cancer Mother   .  Cancer Brother        metastatic lung cancer  . Proteinuria Daughter     Social History   Tobacco Use  . Smoking status: Never Smoker  . Smokeless tobacco: Never Used  Substance Use Topics  . Alcohol use: No  . Drug use: No    Home Medications Prior to Admission medications   Medication Sig Start Date End Date Taking? Authorizing Provider  acetaminophen (TYLENOL) 500 MG tablet Take 1 tablet (500 mg total) by mouth every 6 (six) hours as needed. 07/19/19   Kallie Locks, FNP  atorvastatin (LIPITOR) 40 MG tablet TAKE 2 TABLETS BY MOUTH AT 6PM 09/18/19   Kallie Locks, FNP  atorvastatin (LIPITOR) 80 MG tablet Take 1 tablet (80 mg total) by mouth daily at 6 PM. 01/11/19   Kallie Locks, FNP  clopidogrel (PLAVIX) 75 MG tablet Take 4 tabs (300 mg) once.  Then, on the next day, start 1 tab (75  mg) once daily 03/13/19   Tereso Newcomer T, PA-C  cyclobenzaprine (FLEXERIL) 10 MG tablet Take 1 tablet (10 mg total) by mouth 3 (three) times daily. 04/01/19   Kallie Locks, FNP  diclofenac sodium (VOLTAREN) 1 % GEL Apply 2 g topically 4 (four) times daily. 07/19/19   Kallie Locks, FNP  metoprolol tartrate (LOPRESSOR) 25 MG tablet Take 1 tablet (25 mg total) by mouth 2 (two) times daily. 11/12/18 11/07/19  Tonny Bollman, MD  naproxen (NAPROSYN) 500 MG tablet Take 1 tablet (500 mg total) by mouth 2 (two) times daily with a meal. 07/15/19   Kallie Locks, FNP  nitroGLYCERIN (NITROSTAT) 0.4 MG SL tablet Place 1 tablet (0.4 mg total) under the tongue every 5 (five) minutes x 3 doses as needed for chest pain. 07/13/18   Arty Baumgartner, NP  pantoprazole (PROTONIX) 40 MG tablet Take 1 tablet (40 mg total) by mouth 2 (two) times daily before a meal. 04/01/19   Kallie Locks, FNP  valACYclovir (VALTREX) 500 MG tablet TAKE 1 TABLET BY MOUTH DAILY. TAKE 2 TABLETS BY MOUTH DAILY X 1 WEEK FOR OUTBREAKS. 07/23/19   Kallie Locks, FNP    Allergies    Patient has no known allergies.  Review of Systems   Review of Systems  All other systems reviewed and are negative.   Physical Exam Updated Vital Signs BP 134/66 (BP Location: Right Arm)   Pulse 67   Temp 99 F (37.2 C) (Oral)   Resp 20   Ht 5' 2.5" (1.588 m)   Wt 117.9 kg   SpO2 99%   BMI 46.80 kg/m   Physical Exam Vitals and nursing note reviewed.  Constitutional:      General: She is not in acute distress.    Appearance: She is well-developed. She is obese. She is not ill-appearing, toxic-appearing or diaphoretic.  HENT:     Head: Normocephalic and atraumatic.     Right Ear: External ear normal.     Left Ear: External ear normal.  Eyes:     Conjunctiva/sclera: Conjunctivae normal.     Pupils: Pupils are equal, round, and reactive to light.  Neck:     Trachea: Phonation normal.  Cardiovascular:     Rate and Rhythm:  Normal rate and regular rhythm.     Heart sounds: Normal heart sounds.  Pulmonary:     Effort: Pulmonary effort is normal. No respiratory distress.     Breath sounds: Normal breath sounds. No stridor. No wheezing or  rhonchi.  Musculoskeletal:        General: Normal range of motion.     Cervical back: Normal range of motion and neck supple.  Skin:    General: Skin is warm and dry.  Neurological:     Mental Status: She is alert and oriented to person, place, and time.     Cranial Nerves: No cranial nerve deficit.     Sensory: No sensory deficit.     Motor: No abnormal muscle tone.     Coordination: Coordination normal.  Psychiatric:        Mood and Affect: Mood normal.        Behavior: Behavior normal.        Thought Content: Thought content normal.        Judgment: Judgment normal.     ED Results / Procedures / Treatments   Labs (all labs ordered are listed, but only abnormal results are displayed) Labs Reviewed  URINALYSIS, ROUTINE W REFLEX MICROSCOPIC    EKG None  Radiology No results found.  Procedures Procedures (including critical care time)  Medications Ordered in ED Medications - No data to display  ED Course  I have reviewed the triage vital signs and the nursing notes.  Pertinent labs & imaging results that were available during my care of the patient were reviewed by me and considered in my medical decision making (see chart for details).  Clinical Course as of Oct 03 316  Fri Oct 04, 2019  0316 Normal  Urinalysis, Routine w reflex microscopic [EW]    Clinical Course User Index [EW] Daleen Bo, MD   MDM Rules/Calculators/A&P                       Patient Vitals for the past 24 hrs:  BP Temp Temp src Pulse Resp SpO2 Height Weight  10/04/19 0048 134/66 99 F (37.2 C) Oral 67 20 99 % 5' 2.5" (1.588 m) 117.9 kg    3:17 AM Reevaluation with update and discussion. After initial assessment and treatment, an updated evaluation reveals no change  in clinical status, findings discussed with the patient, all questions answered. Daleen Bo   Medical Decision Making: Patient with known COVID-19 infection presents for question of fever.  Apparently she did not have a fever before tonight and was having trouble getting an accurate reading at home.  In the ED, she is afebrile.  Vital signs are otherwise normal.  There is no respiratory compromise.  She complains of low back pain and has a personal concern for urinary tract infection, so urinalysis was sent.  Urinalysis normal.  Vital signs normal.  No indication for further evaluation, intervention or hospitalization, at this time.  Suspect isolated problem of COVID-19 infection.  Doubt serious bacterial infection, metabolic instability or impending vascular collapse.   Gaia Gullikson was evaluated in Emergency Department on 10/04/2019 for the symptoms described in the history of present illness. She was evaluated in the context of the global COVID-19 pandemic, which necessitated consideration that the patient might be at risk for infection with the SARS-CoV-2 virus that causes COVID-19. Institutional protocols and algorithms that pertain to the evaluation of patients at risk for COVID-19 are in a state of rapid change based on information released by regulatory bodies including the CDC and federal and state organizations. These policies and algorithms were followed during the patient's care in the ED.   CRITICAL CARE-no Performed by: Daleen Bo   Nursing Notes Reviewed/ Care Coordinated  Applicable Imaging Reviewed Interpretation of Laboratory Data incorporated into ED treatment  The patient appears reasonably screened and/or stabilized for discharge and I doubt any other medical condition or other [facility] requiring further screening, evaluation, or treatment in the ED at this time prior to discharge.  Plan: Home Medications-continue usual medication use Tylenol or ibuprofen for fever or pain,  Robitussin-DM for cough.; Home Treatments-rest, fluids, gradual advance diet and activity; return here if the recommended treatment, does not improve the symptoms; Recommended follow up-PCP, as needed    Final Clinical Impression(s) / ED Diagnoses Final diagnoses:  None    Rx / DC Orders ED Discharge Orders    None       Mancel BaleWentz, Zaquan Duffner, MD 10/04/19 [phone]

## 2019-10-04 NOTE — ED Triage Notes (Signed)
Pt reports low grade fever at home, mild cough

## 2019-10-15 ENCOUNTER — Ambulatory Visit: Payer: Self-pay | Admitting: Family Medicine

## 2019-10-17 ENCOUNTER — Telehealth: Payer: Self-pay | Admitting: Family Medicine

## 2019-10-17 ENCOUNTER — Other Ambulatory Visit: Payer: Self-pay | Admitting: Family Medicine

## 2019-10-17 NOTE — Telephone Encounter (Signed)
Patient dx with Covid 10/02/2019. Still c/o fatigue, nausea & loose stools. Nausea worst when lying flat. She wanted to know if you can send in something for nausea? The loose stools are getting better. NO OTHER complaints.   Please advise.

## 2019-10-17 NOTE — Telephone Encounter (Signed)
Will you please schedule this patient?

## 2019-10-18 ENCOUNTER — Other Ambulatory Visit: Payer: Self-pay

## 2019-10-18 ENCOUNTER — Encounter: Payer: Self-pay | Admitting: Family Medicine

## 2019-10-18 ENCOUNTER — Ambulatory Visit (INDEPENDENT_AMBULATORY_CARE_PROVIDER_SITE_OTHER): Payer: BC Managed Care – PPO | Admitting: Family Medicine

## 2019-10-18 DIAGNOSIS — I10 Essential (primary) hypertension: Secondary | ICD-10-CM

## 2019-10-18 DIAGNOSIS — R110 Nausea: Secondary | ICD-10-CM | POA: Insufficient documentation

## 2019-10-18 DIAGNOSIS — B342 Coronavirus infection, unspecified: Secondary | ICD-10-CM | POA: Diagnosis not present

## 2019-10-18 DIAGNOSIS — Z09 Encounter for follow-up examination after completed treatment for conditions other than malignant neoplasm: Secondary | ICD-10-CM

## 2019-10-18 MED ORDER — ONDANSETRON HCL 4 MG PO TABS: 4.0000 mg | ORAL_TABLET | Freq: Three times a day (TID) | ORAL | 1 refills | Status: DC | PRN

## 2019-10-18 NOTE — Progress Notes (Signed)
Virtual Visit via Telephone Note  I connected with Erin Cantu on 10/18/19 at  8:00 AM EST by telephone and verified that I am speaking with the correct person using two identifiers.   I discussed the limitations, risks, security and privacy concerns of performing an evaluation and management service by telephone and the availability of in person appointments. I also discussed with the patient that there may be a patient responsible charge related to this service. The patient expressed understanding and agreed to proceed.   History of Present Illness:  Past Medical History:  Diagnosis Date  . CAD (coronary artery disease), native coronary artery    a. 07/2018 - total occlusion of the second OM with balloon angioplasty   . Cardiac arrest ([place]) 07/11/2018  . Echocardiogram    Echo 10/19: EF 55-60, inferolateral and inferior hypokinesis, mild to moderate mitral regurgitation, mild to moderate LAE, normal RVSF, trivial TR  . Herpes   . HLD (hyperlipidemia)   . Hyperlipidemia   . Hypertension   . STEMI (ST elevation myocardial infarction) ([place]) 07/2018  . Ventricular fibrillation (HCC)     Family History  Problem Relation Age of Onset  . Cancer Paternal Grandmother        breast  . Diabetes Maternal Grandmother   . COPD Father   . Lung cancer Father   . Stroke Mother   . Colon cancer Mother   . Cancer Brother        metastatic lung cancer  . Proteinuria Daughter     Social History   Tobacco Use  . Smoking status: Never Smoker  . Smokeless tobacco: Never Used  Substance Use Topics  . Alcohol use: No  . Drug use: No    Past Surgical History:  Procedure Laterality Date  . ABDOMINAL HYSTERECTOMY  09/2005   done for fibroids  . CORONARY BALLOON ANGIOPLASTY N/A 07/11/2018   Procedure: CORONARY BALLOON ANGIOPLASTY;  Surgeon: Sherren Mocha, MD;  Location: [place] CV LAB;  Service: Cardiovascular;  Laterality: N/A;  . LEFT HEART CATH AND CORONARY ANGIOGRAPHY N/A 07/11/2018    Procedure: LEFT HEART CATH AND CORONARY ANGIOGRAPHY;  Surgeon: Sherren Mocha, MD;  Location: [place] CV LAB;  Service: Cardiovascular;  Laterality: N/A;    No Known Allergies   Current Status: Since hr last office visit, she has c/o nausea since being diagnosed with Coronavirus on 10/02/2019, otherwise she is doing well. She states that both her mother and her father tested positive for COVID19 and a currently hospitalized. She states that she has been out of work for a period of time even though she works at home, but she has been too weak to work. She is feeling better now and plans to began working next Monday. She denies fevers, chills, fatigue, recent infections, weight loss, and night sweats. she has not had any headaches, visual changes, dizziness, and falls. No chest pain, heart palpitations, cough and shortness of breath reported. No reports of GI problems such as vomiting, diarrhea, and constipation. She has no reports of blood in stools, dysuria and hematuria. No depression or anxiety, and denies suicidal ideations, homicidal ideations, or auditory hallucinations. She denies pain today.   Observations/Objective:  Telephone Virtual Visit   Assessment and Plan:  1. Coronavirus infection She is doing well today. Diagnosed with Coronavirus Infection on 10/02/2019. She is currently at home on quarantine since her diagnosis. She is advised to continue to monitor temperature and report to office if symptoms do not improve or worsen.  2. Nausea Moderate. We will initiate anti-nausea medication today.  - ondansetron (ZOFRAN) 4 MG tablet; Take 1 tablet (4 mg total) by mouth every 8 (eight) hours as needed for nausea or vomiting.  Dispense: 20 tablet; Refill: 1  3. Essential hypertension She will continue to take medications as prescribed, to decrease high sodium intake, excessive alcohol intake, increase potassium intake, smoking cessation, and increase physical activity of at least  30 minutes of cardio activity daily. She will continue to follow Heart Healthy or DASH diet.  4. Follow up She will follow up in 1 month.  Current Outpatient Medications on File Prior to Visit  Medication Sig Dispense Refill  . acetaminophen (TYLENOL) 500 MG tablet Take 1 tablet (500 mg total) by mouth every 6 (six) hours as needed. 30 tablet 3  . atorvastatin (LIPITOR) 40 MG tablet TAKE 2 TABLETS BY MOUTH AT 6PM 60 tablet 0  . atorvastatin (LIPITOR) 80 MG tablet Take 1 tablet (80 mg total) by mouth daily at 6 PM. 30 tablet 6  . clopidogrel (PLAVIX) 75 MG tablet Take 4 tabs (300 mg) once.  Then, on the next day, start 1 tab (75 mg) once daily 90 tablet 3  . cyclobenzaprine (FLEXERIL) 10 MG tablet Take 1 tablet (10 mg total) by mouth 3 (three) times daily. 30 tablet 3  . diclofenac sodium (VOLTAREN) 1 % GEL Apply 2 g topically 4 (four) times daily. 2 g 3  . metoprolol tartrate (LOPRESSOR) 25 MG tablet Take 1 tablet (25 mg total) by mouth 2 (two) times daily. 180 tablet 3  . naproxen (NAPROSYN) 500 MG tablet Take 1 tablet (500 mg total) by mouth 2 (two) times daily with a meal. 60 tablet 3  . nitroGLYCERIN (NITROSTAT) 0.4 MG SL tablet Place 1 tablet (0.4 mg total) under the tongue every 5 (five) minutes x 3 doses as needed for chest pain. 25 tablet 2  . pantoprazole (PROTONIX) 40 MG tablet Take 1 tablet (40 mg total) by mouth 2 (two) times daily before a meal. 60 tablet 1  . valACYclovir (VALTREX) 500 MG tablet TAKE 1 TABLET BY MOUTH DAILY. TAKE 2 TABLETS BY MOUTH DAILY X 1 WEEK FOR OUTBREAKS. 60 tablet 3   No current facility-administered medications on file prior to visit.    No orders of the defined types were placed in this encounter.  Referral Orders  No referral(s) requested today    Raliegh Ip,  MSN, FNP-BC [facility] [address] [place], [place] [address] [phone] fax  I discussed  the assessment and treatment plan with the patient. The patient was provided an opportunity to ask questions and all were answered. The patient agreed with the plan and demonstrated an understanding of the instructions.   The patient was advised to call back or seek an in-person evaluation if the symptoms worsen or if the condition fails to improve as anticipated.  I provided 15 minutes of non-face-to-face time during this encounter.   Kallie Locks, FNP

## 2019-10-22 ENCOUNTER — Other Ambulatory Visit: Payer: Self-pay | Admitting: Family Medicine

## 2019-10-22 MED FILL — VALACYCLOVIR HCL 500 MG TAB: 500 | 30 days supply | Qty: 30 | Fill #3

## 2019-10-22 MED FILL — PANTOPRAZOLE SOD DR 40 MG T: 40 | 30 days supply | Qty: 30 | Fill #7

## 2019-10-23 ENCOUNTER — Other Ambulatory Visit: Payer: Self-pay

## 2019-10-23 ENCOUNTER — Other Ambulatory Visit: Payer: Self-pay | Admitting: Family Medicine

## 2019-10-23 ENCOUNTER — Ambulatory Visit: Payer: BC Managed Care – PPO | Attending: Internal Medicine

## 2019-10-23 DIAGNOSIS — Z20822 Contact with and (suspected) exposure to covid-19: Secondary | ICD-10-CM

## 2019-10-23 MED ORDER — ATORVASTATIN CALCIUM 40 MG PO TABS: ORAL_TABLET | ORAL | 3 refills | Status: DC

## 2019-10-24 LAB — NOVEL CORONAVIRUS, NAA: SARS-CoV-2, NAA: NOT DETECTED

## 2019-10-24 NOTE — Progress Notes (Signed)
Cardiology Office Note:    Date:  10/25/2019   ID:  Erin Cantu, DOB [birth year], MRN [id number]  PCP:  Azzie Glatter, FNP  Cardiologist:  Sherren Mocha, MD  Electrophysiologist:  None   Referring MD: Azzie Glatter, FNP   Chief Complaint  Patient presents with  . Chest Pain    History of Present Illness:    Erin Cantu is a 54 y.o. female with:   Coronary artery disease  s/p inferior STEMI in 10/19 c/b VF arrest >> s/p POBA to OM2 (too small for stent)  Ticagrelor ? to Clopidogrel 2/2 bruising  Palpitations  Hyperlipidemia  Hypertension  Hx of COVID-19 in 09/2019   Erin Cantu was last seen in October 2020.  She was diagnosed with COVID-19 in December.  Both of her parents were also diagnosed with COVID-19.  Unfortunately they both passed away about a week ago.  Over the last several days, she is noticed chest pressure.  She has also been nauseated.  She has also been belching.  The chest pressure comes on at rest as well as with activity.  She can do some activities without chest pressure.  She notes shortness of breath with exertion which is unusual.  She has not had orthopnea or true PND.  She has not had leg swelling, syncope.  She has not had any bleeding issues.  Prior CV studies:   The following studies were reviewed today:   Echo 08/02/18 EF 55-60, inf-lat and in HK, mild to mod MR, mild to mod LAE, RVSP 28, trivial TR  Cardiac catheterization 07/11/2018 LM normal LCx normal; OM 2 100-thrombotic RCA normal EF 55-65, inf HK PCI: POBA to the OM2 with residual 25 1. Widely patent/angiographically normal left main, RCA, and LAD 2. 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 Mild contraction abnormality of the left ventricle with preserved overall LVEF  Nuclear stress test5/18/2005 IMPRESSION  No convincing  evidence of myocardial ischemia or infarction. The computer generated polar map suggests anterior reversibility, not seen on the images. Wall motion is normal throughout and the QGSEF measures 67 percent.  Past Medical History:  Diagnosis Date  . CAD (coronary artery disease), native coronary artery    a. 07/2018 - total occlusion of the second OM with balloon angioplasty   . Cardiac arrest ([place]) 07/11/2018  . Echocardiogram    Echo 10/19: EF 55-60, inferolateral and inferior hypokinesis, mild to moderate mitral regurgitation, mild to moderate LAE, normal RVSF, trivial TR  . Herpes   . HLD (hyperlipidemia)   . Hyperlipidemia   . Hypertension   . STEMI (ST elevation myocardial infarction) ([place]) 07/2018  . Ventricular fibrillation [facility])    Surgical Hx: The patient  has a past surgical history that includes Abdominal hysterectomy (09/2005); LEFT HEART CATH AND CORONARY ANGIOGRAPHY (N/A, 07/11/2018); and CORONARY BALLOON ANGIOPLASTY (N/A, 07/11/2018).   Current Medications: Current Meds  Medication Sig  . acetaminophen (TYLENOL) 500 MG tablet Take 1 tablet (500 mg total) by mouth every 6 (six) hours as needed.  Marland Kitchen atorvastatin (LIPITOR) 80 MG tablet Take 1 tablet (80 mg total) by mouth daily at 6 PM.  . clopidogrel (PLAVIX) 75 MG tablet Take 4 tabs (300 mg) once.  Then, on the next day, start 1 tab (75 mg) once daily  . cyclobenzaprine (FLEXERIL) 10 MG tablet Take 1 tablet (10 mg total) by mouth  3 (three) times daily.  . metoprolol tartrate (LOPRESSOR) 25 MG tablet Take 1 tablet (25 mg total) by mouth 2 (two) times daily.  . naproxen (NAPROSYN) 500 MG tablet Take 1 tablet (500 mg total) by mouth 2 (two) times daily with a meal.  . nitroGLYCERIN (NITROSTAT) 0.4 MG SL tablet Place 1 tablet (0.4 mg total) under the tongue every 5 (five) minutes x 3 doses as needed for chest pain.  Marland Kitchen ondansetron (ZOFRAN) 4 MG tablet Take 1 tablet (4 mg total) by mouth every 8 (eight) hours as needed for nausea or  vomiting.  . pantoprazole (PROTONIX) 40 MG tablet Take 1 tablet (40 mg total) by mouth 2 (two) times daily before a meal.  . valACYclovir (VALTREX) 500 MG tablet TAKE 1 TABLET BY MOUTH DAILY. TAKE 2 TABLETS BY MOUTH DAILY X 1 WEEK FOR OUTBREAKS.     Allergies:   Patient has no known allergies.   Social History   Tobacco Use  . Smoking status: Never Smoker  . Smokeless tobacco: Never Used  Substance Use Topics  . Alcohol use: No  . Drug use: No     Family Hx: The patient's family history includes COPD in her father; Cancer in her brother and paternal grandmother; Colon cancer in her mother; Diabetes in her maternal grandmother; Lung cancer in her father; Proteinuria in her daughter; Stroke in her mother.  ROS:   Please see the history of present illness.    ROS All other systems reviewed and are negative.   EKGs/Labs/Other Test Reviewed:    EKG:  EKG is  ordered today.  The ekg ordered today demonstrates normal sinus rhythm, heart rate 68, normal axis, no ST-T wave changes, QTC 423.  When compared to prior tracing 07/2018, T wave inversions have resolved.  Recent Labs: 07/15/2019: ALT 22; BUN 8; Creatinine, Ser 0.86; Hemoglobin 13.4; Platelets 238; Potassium 4.0; Sodium 141; TSH 0.733   Recent Lipid Panel Lab Results  Component Value Date/Time   CHOL 107 07/15/2019 11:32 AM   TRIG 57 07/15/2019 11:32 AM   HDL 49 07/15/2019 11:32 AM   CHOLHDL 2.2 07/15/2019 11:32 AM   CHOLHDL 3.4 07/12/2018 06:34 AM   LDLCALC 45 07/15/2019 11:32 AM    Physical Exam:    VS:  BP 128/86   Pulse 68   Ht 5' 2.5" (1.588 m)   Wt 263 lb 12.8 oz (119.7 kg)   SpO2 95%   BMI 47.48 kg/m     Wt Readings from Last 3 Encounters:  10/25/19 263 lb 12.8 oz (119.7 kg)  10/04/19 260 lb (117.9 kg)  07/15/19 271 lb 3.2 oz (123 kg)     Physical Exam  Constitutional: She is oriented to person, place, and time. She appears well-developed and well-nourished. No distress.  HENT:  Head: Normocephalic  and atraumatic.  Eyes: No scleral icterus.  Neck: No JVD present. No thyromegaly present.  Cardiovascular: Normal rate, regular rhythm and normal heart sounds. Exam reveals no friction rub.  No murmur heard. Pulmonary/Chest: Effort normal and breath sounds normal. She has no wheezes. She has no rales.  Abdominal: Soft. There is no hepatomegaly.  Musculoskeletal:        General: No edema.  Lymphadenopathy:    She has no cervical adenopathy.  Neurological: She is alert and oriented to person, place, and time.  Skin: Skin is warm and dry.  Psychiatric: She has a normal mood and affect.    ASSESSMENT & PLAN:    1. Coronary  artery disease involving native coronary artery of native heart without angina pectoris 2. Precordial chest pain 3. Shortness of breath 4. History of 2019 novel coronavirus disease (COVID-19) History of inferior ST elevation myocardial infarction complicated by VF arrest treated with balloon angioplasty to the OM2.  She had no significant disease elsewhere.  She was diagnosed with COVID-19 in December.  She mainly had fever and diarrhea.  She has recovered from her illness.  Unfortunately her parents both passed away in the past week.  The funeral is this weekend.  Her symptoms may be related to stress/anxiety/grief.  She has recently developed chest pressure with and without exertion.  She has also had shortness of breath with exertion.  Exam is not consistent with volume overload.  Given concern with COVID-19 associated myocarditis, I feel that this should be ruled out.  Her ECG is normal.  T wave inversions that were present in 2019 have resolved.  This is very reassuring.  However, some of her chest discomfort reminds of her previous angina.  Therefore, ischemia needs to be ruled out.  -Labs: BMET, CBC, BNP, ESR, CRP  -Lexiscan Myoview  -Echocardiogram   -Imdur 15 mg once daily   -FU in 3-4 weeks  5. Essential hypertension The patient's blood pressure is controlled on  her current regimen.  Continue current therapy.    6. Mixed hyperlipidemia LDL optimal on most recent lab work.  Continue current Rx.     Dispo:  Return in about 4 weeks (around 11/22/2019) for Follow up after testing, w/ Dr. Burt Knack, or Richardson Dopp, PA-C, (virtual or in-person).   Medication Adjustments/Labs and Tests Ordered: Current medicines are reviewed at length with the patient today.  Concerns regarding medicines are outlined above.  Tests Ordered: Orders Placed This Encounter  Procedures  . CBC  . Basic Metabolic Panel (BMET)  . Pro b natriuretic peptide  . C-reactive protein  . Sedimentation rate  . Myocardial Perfusion Imaging  . EKG 12-Lead  . ECHOCARDIOGRAM COMPLETE   Medication Changes: Meds ordered this encounter  Medications  . isosorbide mononitrate (IMDUR) 30 MG 24 hr tablet    Sig: Take 0.5 tablets (15 mg total) by mouth daily.    Dispense:  45 tablet    Refill:  3    Signed, Richardson Dopp, PA-C  10/25/2019 9:25 AM    [facility] [place], [place], [place]  [address] Phone: [phone]; Fax: [phone]

## 2019-10-25 ENCOUNTER — Ambulatory Visit: Payer: BC Managed Care – PPO | Admitting: Physician Assistant

## 2019-10-25 ENCOUNTER — Encounter: Payer: Self-pay | Admitting: Physician Assistant

## 2019-10-25 ENCOUNTER — Other Ambulatory Visit: Payer: Self-pay

## 2019-10-25 VITALS — BP 128/86 | HR 68 | Ht 62.5 in | Wt 263.8 lb

## 2019-10-25 DIAGNOSIS — E782 Mixed hyperlipidemia: Secondary | ICD-10-CM

## 2019-10-25 DIAGNOSIS — I2510 Atherosclerotic heart disease of native coronary artery without angina pectoris: Secondary | ICD-10-CM | POA: Diagnosis not present

## 2019-10-25 DIAGNOSIS — Z8616 Personal history of COVID-19: Secondary | ICD-10-CM | POA: Diagnosis not present

## 2019-10-25 DIAGNOSIS — R072 Precordial pain: Secondary | ICD-10-CM | POA: Diagnosis not present

## 2019-10-25 DIAGNOSIS — I10 Essential (primary) hypertension: Secondary | ICD-10-CM | POA: Diagnosis not present

## 2019-10-25 DIAGNOSIS — R0602 Shortness of breath: Secondary | ICD-10-CM | POA: Diagnosis not present

## 2019-10-25 MED ORDER — ISOSORBIDE MONONITRATE ER 30 MG PO TB24: 15.0000 mg | ORAL_TABLET | Freq: Every day | ORAL | 3 refills | Status: DC

## 2019-10-25 NOTE — Patient Instructions (Signed)
Medication Instructions:   Your physician has recommended you make the following change in your medication:   1) Start Imdur 30MG, take 0.5 tablet (15MG) by mouth once a day  *If you need a refill on your cardiac medications before your next appointment, please call your pharmacy*  Lab Work:  You will have labs drawn today: BMET, CBC, BNP, ESR, CRP  If you have labs (blood work) drawn today and your tests are completely normal, you will receive your results only by: Marland Kitchen MyChart Message (if you have MyChart) OR . A paper copy in the mail If you have any lab test that is abnormal or we need to change your treatment, we will call you to review the results.  Testing/Procedures:  Your physician has requested that you have an echocardiogram. Echocardiography is a painless test that uses sound waves to create images of your heart. It provides your doctor with information about the size and shape of your heart and how well your heart's chambers and valves are working. This procedure takes approximately one hour. There are no restrictions for this procedure.  Your physician has requested that you have a lexiscan myoview. For further information please visit [email or web address]. Please follow instruction sheet, as given.   Follow-Up: At [facility], you and your health needs are our priority.  As part of our continuing mission to provide you with exceptional heart care, we have created designated Provider Care Teams.  These Care Teams include your primary Cardiologist (physician) and Advanced Practice Providers (APPs -  Physician Assistants and Nurse Practitioners) who all work together to provide you with the care you need, when you need it.  Your next appointment:   3 week(s)  The format for your next appointment:   Either In Person or Virtual  Provider:   You may see Sherren Mocha, MD or Richardson Dopp, PA-C

## 2019-10-29 ENCOUNTER — Telehealth: Payer: Self-pay

## 2019-10-29 DIAGNOSIS — Z8616 Personal history of COVID-19: Secondary | ICD-10-CM

## 2019-10-29 DIAGNOSIS — R0602 Shortness of breath: Secondary | ICD-10-CM

## 2019-10-29 NOTE — Telephone Encounter (Signed)
I called and spoke with patient, she is aware that CBC and ESR need to be rechecked per [facility]. Patient will have these drawn again on 11/04/19 at appointment for stress test.

## 2019-10-30 LAB — CBC

## 2019-10-30 LAB — BASIC METABOLIC PANEL
BUN/Creatinine Ratio: 7 — ABNORMAL LOW (ref 9–23)
BUN: 6 mg/dL (ref 6–24)
CO2: 27 mmol/L (ref 20–29)
Calcium: 9.5 mg/dL (ref 8.7–10.2)
Chloride: 102 mmol/L (ref 96–106)
Creatinine, Ser: 0.89 mg/dL (ref 0.57–1.00)
GFR calc Af Amer: 87 mL/min/{1.73_m2} (ref >59)
GFR calc non Af Amer: 75 mL/min/{1.73_m2} (ref >59)
Glucose: 121 mg/dL — ABNORMAL HIGH (ref 65–99)
Potassium: 4.3 mmol/L (ref 3.5–5.2)
Sodium: 140 mmol/L (ref 134–144)

## 2019-10-30 LAB — C-REACTIVE PROTEIN: CRP: <1 mg/L (ref 0–10)

## 2019-10-30 LAB — PRO B NATRIURETIC PEPTIDE: NT-Pro BNP: 143 pg/mL (ref 0–249)

## 2019-10-30 LAB — SEDIMENTATION RATE

## 2019-11-01 ENCOUNTER — Telehealth (HOSPITAL_COMMUNITY): Payer: Self-pay | Admitting: *Deleted

## 2019-11-01 NOTE — Telephone Encounter (Signed)
Patient given detailed instructions per Myocardial Perfusion Study Information Sheet for the test on 11/04/19 at 8:15. Patient notified to arrive 15 minutes early and that it is imperative to arrive on time for appointment to keep from having the test rescheduled.  If you need to cancel or reschedule your appointment, please call the office within 24 hours of your appointment. . Patient verbalized understanding.Erin Cantu

## 2019-11-04 ENCOUNTER — Other Ambulatory Visit: Payer: Self-pay

## 2019-11-04 ENCOUNTER — Other Ambulatory Visit: Payer: BC Managed Care – PPO | Admitting: *Deleted

## 2019-11-04 ENCOUNTER — Ambulatory Visit (HOSPITAL_COMMUNITY): Payer: BC Managed Care – PPO | Attending: Cardiology

## 2019-11-04 DIAGNOSIS — Z8616 Personal history of COVID-19: Secondary | ICD-10-CM

## 2019-11-04 DIAGNOSIS — R0602 Shortness of breath: Secondary | ICD-10-CM

## 2019-11-04 DIAGNOSIS — R072 Precordial pain: Secondary | ICD-10-CM

## 2019-11-04 DIAGNOSIS — I2510 Atherosclerotic heart disease of native coronary artery without angina pectoris: Secondary | ICD-10-CM | POA: Diagnosis present

## 2019-11-04 LAB — CBC
Hematocrit: 38.3 % (ref 34.0–46.6)
Hemoglobin: 12.6 g/dL (ref 11.1–15.9)
MCH: 29.4 pg (ref 26.6–33.0)
MCHC: 32.9 g/dL (ref 31.5–35.7)
MCV: 89 fL (ref 79–97)
Platelets: 244 10*3/uL (ref 150–450)
RBC: 4.29 x10E6/uL (ref 3.77–5.28)
RDW: 13.9 % (ref 11.7–15.4)
WBC: 4.8 10*3/uL (ref 3.4–10.8)

## 2019-11-04 LAB — SEDIMENTATION RATE: Sed Rate: 8 mm/hr (ref 0–40)

## 2019-11-04 MED ORDER — REGADENOSON 0.4 MG/5ML IV SOLN
0.4000 mg | Freq: Once | INTRAVENOUS | Status: AC
  Administered 2019-11-04: 09:00:00 0.4 mg via INTRAVENOUS

## 2019-11-04 MED ORDER — TECHNETIUM TC 99M TETROFOSMIN IV KIT
32.3000 | PACK | Freq: Once | INTRAVENOUS | Status: AC | PRN
  Administered 2019-11-04: 32.3 via INTRAVENOUS
  Filled 2019-11-04: qty 33

## 2019-11-07 ENCOUNTER — Ambulatory Visit (HOSPITAL_COMMUNITY): Payer: BC Managed Care – PPO | Attending: Cardiology

## 2019-11-07 ENCOUNTER — Encounter: Payer: Self-pay | Admitting: Physician Assistant

## 2019-11-07 ENCOUNTER — Encounter (HOSPITAL_COMMUNITY): Payer: BC Managed Care – PPO

## 2019-11-07 ENCOUNTER — Other Ambulatory Visit: Payer: Self-pay

## 2019-11-07 DIAGNOSIS — R072 Precordial pain: Secondary | ICD-10-CM | POA: Insufficient documentation

## 2019-11-07 DIAGNOSIS — Z8616 Personal history of COVID-19: Secondary | ICD-10-CM | POA: Diagnosis not present

## 2019-11-07 DIAGNOSIS — R0602 Shortness of breath: Secondary | ICD-10-CM | POA: Insufficient documentation

## 2019-11-07 LAB — MYOCARDIAL PERFUSION IMAGING
LV dias vol: 66 mL (ref 46–106)
LV sys vol: 22 mL
Peak HR: 100 {beats}/min
Rest HR: 65 {beats}/min
SDS: 4
SRS: 0
SSS: 4
TID: 1.03

## 2019-11-07 MED ORDER — TECHNETIUM TC 99M TETROFOSMIN IV KIT
30.5000 | PACK | Freq: Once | INTRAVENOUS | Status: AC | PRN
  Administered 2019-11-07: 09:00:00 30.5 via INTRAVENOUS
  Filled 2019-11-07: qty 31

## 2019-11-19 ENCOUNTER — Ambulatory Visit: Payer: Self-pay | Admitting: Family Medicine

## 2019-12-03 MED FILL — VALACYCLOVIR HCL 500 MG TAB: 500 | 30 days supply | Qty: 30 | Fill #4

## 2019-12-24 ENCOUNTER — Other Ambulatory Visit: Payer: Self-pay | Admitting: Cardiovascular Disease

## 2019-12-24 MED FILL — CLOPIDOGREL 75 MG TABLET: 75 | 30 days supply | Qty: 30 | Fill #5

## 2019-12-25 MED FILL — METOPROLOL TARTRATE 25 MG T: 25 | 30 days supply | Qty: 60 | Fill #0

## 2020-01-07 ENCOUNTER — Telehealth: Payer: Self-pay

## 2020-01-07 ENCOUNTER — Other Ambulatory Visit: Payer: Self-pay

## 2020-01-07 ENCOUNTER — Other Ambulatory Visit: Payer: Self-pay | Admitting: Physician Assistant

## 2020-01-07 DIAGNOSIS — E782 Mixed hyperlipidemia: Secondary | ICD-10-CM

## 2020-01-07 MED ORDER — CLOPIDOGREL BISULFATE 75 MG PO TABS: 75.0000 mg | ORAL_TABLET | Freq: Every day | ORAL | 2 refills | Status: DC

## 2020-01-07 NOTE — Telephone Encounter (Signed)
° ° °*  STAT* If patient is at the pharmacy, call can be transferred to refill team.   1. Which medications need to be refilled? (please list name of each medication and dose if known) clopidogrel (PLAVIX) 75 MG tablet  2. Which pharmacy/location (including street and city if local pharmacy) is medication to be sent to? [facility], [address] [place], [place] [address]  3. Do they need a 30 day or 90 day supply? 90 days

## 2020-01-07 NOTE — Telephone Encounter (Signed)
Erin Cantu has requested a 90 day supply of Atorvastatin with refills. Will it be okay to send?

## 2020-01-07 NOTE — Telephone Encounter (Signed)
Pt's medication was sent to pt's pharmacy as requested. Confirmation received.  °

## 2020-01-07 NOTE — Telephone Encounter (Signed)
Will it be okay for Erin Cantu to start getting a 90 day supply of Rx Atorvastatin?

## 2020-01-08 ENCOUNTER — Telehealth: Payer: Self-pay

## 2020-01-08 ENCOUNTER — Other Ambulatory Visit: Payer: Self-pay

## 2020-01-08 DIAGNOSIS — E782 Mixed hyperlipidemia: Secondary | ICD-10-CM

## 2020-01-08 MED ORDER — ATORVASTATIN CALCIUM 80 MG PO TABS: 80.0000 mg | ORAL_TABLET | Freq: Every day | ORAL | 0 refills | Status: DC

## 2020-01-08 MED ORDER — CLOPIDOGREL BISULFATE 75 MG PO TABS: 75.0000 mg | ORAL_TABLET | Freq: Every day | ORAL | 2 refills | Status: DC

## 2020-01-08 NOTE — Addendum Note (Signed)
Addended by: Demetrios Loll on: 01/08/2020 09:51 AM   Modules accepted: Orders

## 2020-01-08 NOTE — Telephone Encounter (Signed)
Mrs. Armato insurance will expire today. She wanted to ask again for the 90 day supply.

## 2020-01-08 NOTE — Telephone Encounter (Signed)
A one-time 90 day supply sent to pharmacy. Patient has a follow up with Raliegh Ip NP. Patient must keep future appointments for refills.

## 2020-01-20 ENCOUNTER — Other Ambulatory Visit: Payer: Self-pay

## 2020-01-20 ENCOUNTER — Encounter: Payer: Self-pay | Admitting: Family Medicine

## 2020-01-20 ENCOUNTER — Ambulatory Visit (INDEPENDENT_AMBULATORY_CARE_PROVIDER_SITE_OTHER): Payer: BC Managed Care – PPO | Admitting: Family Medicine

## 2020-01-20 ENCOUNTER — Other Ambulatory Visit: Payer: Self-pay | Admitting: Family Medicine

## 2020-01-20 VITALS — BP 128/62 | HR 61 | Temp 98.5°F | Ht 62.5 in | Wt 260.4 lb

## 2020-01-20 DIAGNOSIS — B342 Coronavirus infection, unspecified: Secondary | ICD-10-CM

## 2020-01-20 DIAGNOSIS — Z0000 Encounter for general adult medical examination without abnormal findings: Secondary | ICD-10-CM

## 2020-01-20 DIAGNOSIS — M545 Low back pain, unspecified: Secondary | ICD-10-CM

## 2020-01-20 DIAGNOSIS — F419 Anxiety disorder, unspecified: Secondary | ICD-10-CM

## 2020-01-20 DIAGNOSIS — Z09 Encounter for follow-up examination after completed treatment for conditions other than malignant neoplasm: Secondary | ICD-10-CM

## 2020-01-20 DIAGNOSIS — E782 Mixed hyperlipidemia: Secondary | ICD-10-CM

## 2020-01-20 DIAGNOSIS — M25512 Pain in left shoulder: Secondary | ICD-10-CM

## 2020-01-20 DIAGNOSIS — F4321 Adjustment disorder with depressed mood: Secondary | ICD-10-CM

## 2020-01-20 DIAGNOSIS — G8929 Other chronic pain: Secondary | ICD-10-CM

## 2020-01-20 DIAGNOSIS — I10 Essential (primary) hypertension: Secondary | ICD-10-CM

## 2020-01-20 DIAGNOSIS — S161XXA Strain of muscle, fascia and tendon at neck level, initial encounter: Secondary | ICD-10-CM

## 2020-01-20 LAB — POCT URINALYSIS DIPSTICK
Bilirubin, UA: NEGATIVE
Blood, UA: NEGATIVE
Glucose, UA: NEGATIVE
Ketones, UA: NEGATIVE
Leukocytes, UA: NEGATIVE
Nitrite, UA: NEGATIVE
Protein, UA: NEGATIVE
Spec Grav, UA: >=1.03 — AB (ref 1.010–1.025)
Urobilinogen, UA: 1 E.U./dL
pH, UA: 5.5 (ref 5.0–8.0)

## 2020-01-20 LAB — POCT GLYCOSYLATED HEMOGLOBIN (HGB A1C): Hemoglobin A1C: 6.1 % — AB (ref 4.0–5.6)

## 2020-01-20 LAB — GLUCOSE, POCT (MANUAL RESULT ENTRY): POC Glucose: 109 mg/dl — AB (ref 70–99)

## 2020-01-20 MED ORDER — METOPROLOL TARTRATE 25 MG PO TABS: 25.0000 mg | ORAL_TABLET | Freq: Two times a day (BID) | ORAL | 2 refills | Status: AC

## 2020-01-20 MED ORDER — ATORVASTATIN CALCIUM 80 MG PO TABS: 80.0000 mg | ORAL_TABLET | Freq: Every day | ORAL | 0 refills | Status: AC

## 2020-01-20 MED ORDER — CYCLOBENZAPRINE HCL 10 MG PO TABS: 10.0000 mg | ORAL_TABLET | Freq: Three times a day (TID) | ORAL | 3 refills | Status: DC

## 2020-01-20 MED ORDER — CLOPIDOGREL BISULFATE 75 MG PO TABS: 75.0000 mg | ORAL_TABLET | Freq: Every day | ORAL | 2 refills | Status: DC

## 2020-01-20 MED ORDER — NAPROXEN 500 MG PO TABS: 500.0000 mg | ORAL_TABLET | Freq: Two times a day (BID) | ORAL | 3 refills | Status: DC

## 2020-01-20 NOTE — Progress Notes (Signed)
[facility]    Established Patient Office Visit  Subjective:  Patient ID: Erin Cantu, female    DOB: 1966-06-05  Age: 54 y.o. MRN: [id number]  CC:  Chief Complaint  Patient presents with  . Follow-up    HTN  . Back Pain    RIGHT LOWER BACK PAIN    HPI Erin Cantu is a 54 year old female who presents for Follow Up today.   Past Medical History:  Diagnosis Date  . CAD (coronary artery disease), native coronary artery    a. 07/2018 - total occlusion of the second OM with balloon angioplasty  // Myoview 10/2019: EF 66, normal perfusion; Low Risk   . Cardiac arrest (HCC) 07/11/2018  . Echocardiogram    Echo 10/19: EF 55-60, inferolateral and inferior hypokinesis, mild to moderate mitral regurgitation, mild to moderate LAE, normal RVSF, trivial TR // Echocardiogram 10/2019: EF 60-65, Gr 1 DD, trivial MR, trivial TR   . Herpes   . HLD (hyperlipidemia)   . Hyperlipidemia   . Hypertension   . STEMI (ST elevation myocardial infarction) (HCC) 07/2018  . Ventricular fibrillation (HCC)    Current Status: Since her last office visit, she is doing well with no complaints. Her anxiety is increased today. She recently lost her mother and father due to Coronavirus. They passed 3 days apart. She is also recuperating from Coronavirus infection. She denies visual changes, chest pain, cough, shortness of breath, heart palpitations, and falls. She has occasional headaches and dizziness with position changes. Denies severe headaches, confusion, seizures, double vision, and blurred vision, nausea and vomiting. She denies fevers, chills, fatigue, recent infections, weight loss, and night sweats. No reports of GI problems such as diarrhea, and constipation. She has no reports of blood in stools, dysuria and hematuria. No depression or anxiety, and denies suicidal ideations, homicidal ideations, or auditory hallucinations. She is taking all medications as  prescribed. She denies pain today.   Past Surgical History:  Procedure Laterality Date  . ABDOMINAL HYSTERECTOMY  09/2005   done for fibroids  . CORONARY BALLOON ANGIOPLASTY N/A 07/11/2018   Procedure: CORONARY BALLOON ANGIOPLASTY;  Surgeon: Tonny Bollman, MD;  Location: [facility] INVASIVE CV LAB;  Service: Cardiovascular;  Laterality: N/A;  . LEFT HEART CATH AND CORONARY ANGIOGRAPHY N/A 07/11/2018   Procedure: LEFT HEART CATH AND CORONARY ANGIOGRAPHY;  Surgeon: Tonny Bollman, MD;  Location: [facility] INVASIVE CV LAB;  Service: Cardiovascular;  Laterality: N/A;    Family History  Problem Relation Age of Onset  . Cancer Paternal Grandmother        breast  . Diabetes Maternal Grandmother   . COPD Father   . Lung cancer Father   . Stroke Mother   . Colon cancer Mother   . Cancer Brother        metastatic lung cancer  . Proteinuria Daughter     Social History   Socioeconomic History  . Marital status: Divorced    Spouse name: Not on file  . Number of children: Not on file  . Years of education: Not on file  . Highest education level: Not on file  Occupational History  . Not on file  Tobacco Use  . Smoking status: Never Smoker  . Smokeless tobacco: Never Used  Substance and Sexual Activity  . Alcohol use: No  . Drug use: No  . Sexual activity: Yes    Birth control/protection: Post-menopausal, Surgical    Comment: hysterectomy  Other Topics Concern  .  Not on file  Social History Narrative  . Not on file   Social Determinants of Health   Financial Resource Strain:   . Difficulty of Paying Living Expenses:   Food Insecurity:   . Worried About Charity fundraiser in the Last Year:   . Arboriculturist in the Last Year:   Transportation Needs:   . Film/video editor (Medical):   Marland Kitchen Lack of Transportation (Non-Medical):   Physical Activity:   . Days of Exercise per Week:   . Minutes of Exercise per Session:   Stress:   . Feeling of Stress :   Social Connections:   .  Frequency of Communication with Friends and Family:   . Frequency of Social Gatherings with Friends and Family:   . Attends Religious Services:   . Active Member of Clubs or Organizations:   . Attends Archivist Meetings:   Marland Kitchen Marital Status:   Intimate Partner Violence:   . Fear of Current or Ex-Partner:   . Emotionally Abused:   Marland Kitchen Physically Abused:   . Sexually Abused:     Outpatient Medications Prior to Visit  Medication Sig Dispense Refill  . acetaminophen (TYLENOL) 500 MG tablet Take 1 tablet (500 mg total) by mouth every 6 (six) hours as needed. 30 tablet 3  . valACYclovir (VALTREX) 500 MG tablet TAKE 1 TABLET BY MOUTH DAILY. TAKE 2 TABLETS BY MOUTH DAILY X 1 WEEK FOR OUTBREAKS. 60 tablet 3  . atorvastatin (LIPITOR) 80 MG tablet Take 1 tablet (80 mg total) by mouth daily at 6 PM. 90 tablet 0  . clopidogrel (PLAVIX) 75 MG tablet Take 1 tablet (75 mg total) by mouth daily. 90 tablet 2  . metoprolol tartrate (LOPRESSOR) 25 MG tablet Take 1 tablet (25 mg total) by mouth 2 (two) times daily. 180 tablet 2  . naproxen (NAPROSYN) 500 MG tablet Take 1 tablet (500 mg total) by mouth 2 (two) times daily with a meal. 60 tablet 3  . nitroGLYCERIN (NITROSTAT) 0.4 MG SL tablet Place 1 tablet (0.4 mg total) under the tongue every 5 (five) minutes x 3 doses as needed for chest pain. (Patient not taking: Reported on 01/20/2020) 25 tablet 2  . cyclobenzaprine (FLEXERIL) 10 MG tablet Take 1 tablet (10 mg total) by mouth 3 (three) times daily. (Patient not taking: Reported on 01/20/2020) 30 tablet 3  . isosorbide mononitrate (IMDUR) 30 MG 24 hr tablet Take 0.5 tablets (15 mg total) by mouth daily. 45 tablet 3  . ondansetron (ZOFRAN) 4 MG tablet Take 1 tablet (4 mg total) by mouth every 8 (eight) hours as needed for nausea or vomiting. (Patient not taking: Reported on 01/20/2020) 20 tablet 1  . pantoprazole (PROTONIX) 40 MG tablet Take 1 tablet (40 mg total) by mouth 2 (two) times daily before a  meal. 60 tablet 1   No facility-administered medications prior to visit.    No Known Allergies  ROS Review of Systems  Constitutional: Negative.   HENT: Negative.   Eyes: Negative.   Respiratory: Negative.   Cardiovascular: Negative.   Gastrointestinal: Negative.   Endocrine: Negative.   Genitourinary: Negative.   Musculoskeletal: Negative.   Skin: Negative.   Allergic/Immunologic: Negative.   Neurological: Positive for dizziness (occasional) and headaches (occasional).  Hematological: Negative.   Psychiatric/Behavioral: Negative.       Objective:    Physical Exam  Constitutional: She is oriented to person, place, and time. She appears well-developed and well-nourished.  HENT:  Head: Normocephalic and atraumatic.  Eyes: Conjunctivae are normal.  Cardiovascular: Normal rate, regular rhythm, normal heart sounds and intact distal pulses.  Pulmonary/Chest: Effort normal and breath sounds normal.  Abdominal: Soft. Bowel sounds are normal.  Musculoskeletal:     Cervical back: Normal range of motion and neck supple.     Comments: Limited ROM in spine and shoulder  Neurological: She is alert and oriented to person, place, and time. She has normal reflexes.  Skin: Skin is warm and dry.  Psychiatric: She has a normal mood and affect. Her behavior is normal. Judgment and thought content normal.  Nursing note and vitals reviewed.   BP 128/62   Pulse 61   Temp 98.5 F (36.9 C) (Oral)   Ht 5' 2.5" (1.588 m)   Wt 260 lb 6.4 oz (118.1 kg)   SpO2 100%   BMI 46.87 kg/m  Wt Readings from Last 3 Encounters:  01/20/20 260 lb 6.4 oz (118.1 kg)  11/04/19 263 lb (119.3 kg)  10/25/19 263 lb 12.8 oz (119.7 kg)     Health Maintenance Due  Topic Date Due  . MAMMOGRAM  Never done  . COLONOSCOPY  Never done    There are no preventive care reminders to display for this patient.  Lab Results  Component Value Date   TSH 0.733 07/15/2019   Lab Results  Component Value Date    WBC 4.8 11/04/2019   HGB 12.6 11/04/2019   HCT 38.3 11/04/2019   MCV 89 11/04/2019   PLT 244 11/04/2019   Lab Results  Component Value Date   NA 140 10/25/2019   K 4.3 10/25/2019   CO2 27 10/25/2019   GLUCOSE 121 (H) 10/25/2019   BUN 6 10/25/2019   CREATININE 0.89 10/25/2019   BILITOT 0.4 07/15/2019   ALKPHOS 152 (H) 07/15/2019   AST 19 07/15/2019   ALT 22 07/15/2019   PROT 6.6 07/15/2019   ALBUMIN 4.0 07/15/2019   CALCIUM 9.5 10/25/2019   ANIONGAP 7 07/12/2018   Lab Results  Component Value Date   CHOL 107 07/15/2019   Lab Results  Component Value Date   HDL 49 07/15/2019   Lab Results  Component Value Date   LDLCALC 45 07/15/2019   Lab Results  Component Value Date   TRIG 57 07/15/2019   Lab Results  Component Value Date   CHOLHDL 2.2 07/15/2019   Lab Results  Component Value Date   HGBA1C 6.1 (A) 01/20/2020      Assessment & Plan:   1. Essential hypertension The current medical regimen is effective; blood pressure is stable at 128/62 today; continue present plan and medications as prescribed. She will continue to take medications as prescribed, to decrease high sodium intake, excessive alcohol intake, increase potassium intake, smoking cessation, and increase physical activity of at least 30 minutes of cardio activity daily. She will continue to follow Heart Healthy or DASH diet. - clopidogrel (PLAVIX) 75 MG tablet; Take 1 tablet (75 mg total) by mouth daily.  Dispense: 90 tablet; Refill: 2 - metoprolol tartrate (LOPRESSOR) 25 MG tablet; Take 1 tablet (25 mg total) by mouth 2 (two) times daily.  Dispense: 180 tablet; Refill: 2  2. Mixed hyperlipidemia - atorvastatin (LIPITOR) 80 MG tablet; Take 1 tablet (80 mg total) by mouth daily at 6 PM.  Dispense: 90 tablet; Refill: 0  3. Coronavirus infection Resolved.  4. Anxiety Stable.   5. Neck muscle strain, initial encounter - cyclobenzaprine (FLEXERIL) 10 MG tablet; Take 1 tablet (10  mg total) by mouth  3 (three) times daily.  Dispense: 30 tablet; Refill: 3  6. Low back pain without sciatica, unspecified back pain laterality, unspecified chronicity  7. Chronic left shoulder pain - naproxen (NAPROSYN) 500 MG tablet; Take 1 tablet (500 mg total) by mouth 2 (two) times daily with a meal.  Dispense: 60 tablet; Refill: 3  8. Healthcare maintenance - POCT glycosylated hemoglobin (Hb A1C) - POCT urinalysis dipstick - POCT glucose (manual entry) - CBC with Differential - Comprehensive metabolic panel - Lipid Panel - TSH - Vitamin B12 - Vitamin D, 25-hydroxy  9. Grieving   10. Follow up She will follow up un 3 months.   Meds ordered this encounter  Medications  . cyclobenzaprine (FLEXERIL) 10 MG tablet    Sig: Take 1 tablet (10 mg total) by mouth 3 (three) times daily.    Dispense:  30 tablet    Refill:  3  . atorvastatin (LIPITOR) 80 MG tablet    Sig: Take 1 tablet (80 mg total) by mouth daily at 6 PM.    Dispense:  90 tablet    Refill:  0  . clopidogrel (PLAVIX) 75 MG tablet    Sig: Take 1 tablet (75 mg total) by mouth daily.    Dispense:  90 tablet    Refill:  2  . metoprolol tartrate (LOPRESSOR) 25 MG tablet    Sig: Take 1 tablet (25 mg total) by mouth 2 (two) times daily.    Dispense:  180 tablet    Refill:  2  . naproxen (NAPROSYN) 500 MG tablet    Sig: Take 1 tablet (500 mg total) by mouth 2 (two) times daily with a meal.    Dispense:  60 tablet    Refill:  3    Orders Placed This Encounter  Procedures  . CBC with Differential  . Comprehensive metabolic panel  . Lipid Panel  . TSH  . Vitamin B12  . Vitamin D, 25-hydroxy  . POCT glycosylated hemoglobin (Hb A1C)  . POCT urinalysis dipstick  . POCT glucose (manual entry)    Referral Orders  No referral(s) requested today    Raliegh Ip,  MSN, FNP-BC [facility] [address] [place], [place] [address] [phone]  fax     Problem List Items Addressed This Visit      Cardiovascular and Mediastinum   Hypertension - Primary   Relevant Medications   atorvastatin (LIPITOR) 80 MG tablet   clopidogrel (PLAVIX) 75 MG tablet   metoprolol tartrate (LOPRESSOR) 25 MG tablet     Other   Chronic left shoulder pain   Relevant Medications   cyclobenzaprine (FLEXERIL) 10 MG tablet   naproxen (NAPROSYN) 500 MG tablet   Coronavirus infection   Hyperlipidemia   Relevant Medications   atorvastatin (LIPITOR) 80 MG tablet   metoprolol tartrate (LOPRESSOR) 25 MG tablet    Other Visit Diagnoses    Anxiety       Neck muscle strain, initial encounter       Relevant Medications   cyclobenzaprine (FLEXERIL) 10 MG tablet   Low back pain without sciatica, unspecified back pain laterality, unspecified chronicity       Relevant Medications   cyclobenzaprine (FLEXERIL) 10 MG tablet   naproxen (NAPROSYN) 500 MG tablet   Healthcare maintenance       Relevant Orders   POCT glycosylated hemoglobin (Hb A1C) (Completed)   POCT urinalysis dipstick (Completed)  POCT glucose (manual entry) (Completed)   CBC with Differential   Comprehensive metabolic panel   Lipid Panel   TSH   Vitamin B12   Vitamin D, 25-hydroxy   Follow up          Meds ordered this encounter  Medications  . cyclobenzaprine (FLEXERIL) 10 MG tablet    Sig: Take 1 tablet (10 mg total) by mouth 3 (three) times daily.    Dispense:  30 tablet    Refill:  3  . atorvastatin (LIPITOR) 80 MG tablet    Sig: Take 1 tablet (80 mg total) by mouth daily at 6 PM.    Dispense:  90 tablet    Refill:  0  . clopidogrel (PLAVIX) 75 MG tablet    Sig: Take 1 tablet (75 mg total) by mouth daily.    Dispense:  90 tablet    Refill:  2  . metoprolol tartrate (LOPRESSOR) 25 MG tablet    Sig: Take 1 tablet (25 mg total) by mouth 2 (two) times daily.    Dispense:  180 tablet    Refill:  2  . naproxen (NAPROSYN) 500 MG tablet    Sig: Take 1 tablet (500 mg  total) by mouth 2 (two) times daily with a meal.    Dispense:  60 tablet    Refill:  3    Follow-up: Return in about 3 months (around 04/20/2020).    Kallie Locks, FNP

## 2020-01-21 LAB — TSH: TSH: 1.02 u[IU]/mL (ref 0.450–4.500)

## 2020-01-21 LAB — CBC WITH DIFFERENTIAL/PLATELET
Basophils Absolute: 0 10*3/uL (ref 0.0–0.2)
Basos: 1 %
EOS (ABSOLUTE): 0.1 10*3/uL (ref 0.0–0.4)
Eos: 2 %
Hematocrit: 38.6 % (ref 34.0–46.6)
Hemoglobin: 13 g/dL (ref 11.1–15.9)
Immature Grans (Abs): 0 10*3/uL (ref 0.0–0.1)
Immature Granulocytes: 0 %
Lymphocytes Absolute: 1.9 10*3/uL (ref 0.7–3.1)
Lymphs: 36 %
MCH: 29.5 pg (ref 26.6–33.0)
MCHC: 33.7 g/dL (ref 31.5–35.7)
MCV: 88 fL (ref 79–97)
Monocytes Absolute: 0.4 10*3/uL (ref 0.1–0.9)
Monocytes: 8 %
Neutrophils Absolute: 2.9 10*3/uL (ref 1.4–7.0)
Neutrophils: 53 %
Platelets: 228 10*3/uL (ref 150–450)
RBC: 4.4 x10E6/uL (ref 3.77–5.28)
RDW: 12.8 % (ref 11.7–15.4)
WBC: 5.4 10*3/uL (ref 3.4–10.8)

## 2020-01-21 LAB — COMPREHENSIVE METABOLIC PANEL
ALT: 17 IU/L (ref 0–32)
AST: 18 IU/L (ref 0–40)
Albumin/Globulin Ratio: 1.8 (ref 1.2–2.2)
Albumin: 4.3 g/dL (ref 3.8–4.9)
Alkaline Phosphatase: 140 IU/L — ABNORMAL HIGH (ref 39–117)
BUN/Creatinine Ratio: 11 (ref 9–23)
BUN: 9 mg/dL (ref 6–24)
Bilirubin Total: 0.4 mg/dL (ref 0.0–1.2)
CO2: 23 mmol/L (ref 20–29)
Calcium: 9.1 mg/dL (ref 8.7–10.2)
Chloride: 105 mmol/L (ref 96–106)
Creatinine, Ser: 0.85 mg/dL (ref 0.57–1.00)
GFR calc Af Amer: 92 mL/min/{1.73_m2} (ref >59)
GFR calc non Af Amer: 80 mL/min/{1.73_m2} (ref >59)
Globulin, Total: 2.4 g/dL (ref 1.5–4.5)
Glucose: 101 mg/dL — ABNORMAL HIGH (ref 65–99)
Potassium: 3.9 mmol/L (ref 3.5–5.2)
Sodium: 142 mmol/L (ref 134–144)
Total Protein: 6.7 g/dL (ref 6.0–8.5)

## 2020-01-21 LAB — VITAMIN B12: Vitamin B-12: 829 pg/mL (ref 232–1245)

## 2020-01-21 LAB — LIPID PANEL
Chol/HDL Ratio: 2.4 ratio (ref 0.0–4.4)
Cholesterol, Total: 119 mg/dL (ref 100–199)
HDL: 50 mg/dL (ref >39)
LDL Chol Calc (NIH): 58 mg/dL (ref 0–99)
Triglycerides: 43 mg/dL (ref 0–149)
VLDL Cholesterol Cal: 11 mg/dL (ref 5–40)

## 2020-01-21 LAB — VITAMIN D 25 HYDROXY (VIT D DEFICIENCY, FRACTURES): Vit D, 25-Hydroxy: 42.1 ng/mL (ref 30.0–100.0)

## 2020-01-21 MED FILL — ?METOPROLOL TART 25MG TABLE: 25 | 30 days supply | Qty: 60 | Fill #0

## 2020-01-21 MED FILL — ?CLOPIDOGREL 75MG TA: 75 | 30 days supply | Qty: 30 | Fill #0

## 2020-01-21 MED FILL — ATORVASTATIN 80 MG TABLET: 80 | 30 days supply | Qty: 30 | Fill #0

## 2020-02-25 MED FILL — ?METOPROLOL TART 25MG TABLE: 25 | 30 days supply | Qty: 60 | Fill #1

## 2020-02-25 MED FILL — ATORVASTATIN 80 MG TABLET: 80 | 30 days supply | Qty: 30 | Fill #1

## 2020-02-25 MED FILL — ?CLOPIDOGREL 75MG TA: 75 | 30 days supply | Qty: 30 | Fill #1

## 2020-03-25 ENCOUNTER — Encounter: Payer: Self-pay | Admitting: Nurse Practitioner

## 2020-03-25 ENCOUNTER — Ambulatory Visit (INDEPENDENT_AMBULATORY_CARE_PROVIDER_SITE_OTHER): Payer: Self-pay | Admitting: Nurse Practitioner

## 2020-03-25 ENCOUNTER — Other Ambulatory Visit: Payer: Self-pay

## 2020-03-25 VITALS — BP 116/70 | HR 74 | Temp 97.3°F | Ht 62.5 in | Wt 261.4 lb

## 2020-03-25 DIAGNOSIS — M545 Low back pain, unspecified: Secondary | ICD-10-CM

## 2020-03-25 DIAGNOSIS — R5383 Other fatigue: Secondary | ICD-10-CM

## 2020-03-25 LAB — POCT URINALYSIS DIPSTICK
Bilirubin, UA: NEGATIVE
Blood, UA: NEGATIVE
Glucose, UA: NEGATIVE
Ketones, UA: NEGATIVE
Leukocytes, UA: NEGATIVE
Nitrite, UA: NEGATIVE
Protein, UA: NEGATIVE
Spec Grav, UA: >=1.03 — AB (ref 1.010–1.025)
Urobilinogen, UA: 0.2 E.U./dL
pH, UA: 5.5 (ref 5.0–8.0)

## 2020-03-25 MED FILL — ATORVASTATIN 80 MG TABLET: 80 | 30 days supply | Qty: 30 | Fill #2

## 2020-03-25 MED FILL — ?CLOPIDOGREL 75MG TA: 75 | 30 days supply | Qty: 30 | Fill #2

## 2020-03-25 NOTE — Patient Instructions (Signed)
Acute Back Pain, Adult Acute back pain is sudden and usually short-lived. It is often caused by an injury to the muscles and tissues in the back. The injury may result from:  A muscle or ligament getting overstretched or torn (strained). Ligaments are tissues that connect bones to each other. Lifting something improperly can cause a back strain.  Wear and tear (degeneration) of the spinal disks. Spinal disks are circular tissue that provides cushioning between the bones of the spine (vertebrae).  Twisting motions, such as while playing sports or doing yard work.  A hit to the back.  Arthritis. You may have a physical exam, lab tests, and imaging tests to find the cause of your pain. Acute back pain usually goes away with rest and home care. Follow these instructions at home: Managing pain, stiffness, and swelling  Take over-the-counter and prescription medicines only as told by your health care provider.  Your health care provider may recommend applying ice during the first 24-48 hours after your pain starts. To do this: ? Put ice in a plastic bag. ? Place a towel between your skin and the bag. ? Leave the ice on for 20 minutes, 2-3 times a day.  If directed, apply heat to the affected area as often as told by your health care provider. Use the heat source that your health care provider recommends, such as a moist heat pack or a heating pad. ? Place a towel between your skin and the heat source. ? Leave the heat on for 20-30 minutes. ? Remove the heat if your skin turns bright red. This is especially important if you are unable to feel pain, heat, or cold. You have a greater risk of getting burned. Activity   Do not stay in bed. Staying in bed for more than 1-2 days can delay your recovery.  Sit up and stand up straight. Avoid leaning forward when you sit, or hunching over when you stand. ? If you work at a desk, sit close to it so you do not need to lean over. Keep your chin tucked  in. Keep your neck drawn back, and keep your elbows bent at a right angle. Your arms should look like the letter "L." ? Sit high and close to the steering wheel when you drive. Add lower back (lumbar) support to your car seat, if needed.  Take short walks on even surfaces as soon as you are able. Try to increase the length of time you walk each day.  Do not sit, drive, or stand in one place for more than 30 minutes at a time. Sitting or standing for long periods of time can put stress on your back.  Do not drive or use heavy machinery while taking prescription pain medicine.  Use proper lifting techniques. When you bend and lift, use positions that put less stress on your back: ? Bend your knees. ? Keep the load close to your body. ? Avoid twisting.  Exercise regularly as told by your health care provider. Exercising helps your back heal faster and helps prevent back injuries by keeping muscles strong and flexible.  Work with a physical therapist to make a safe exercise program, as recommended by your health care provider. Do any exercises as told by your physical therapist. Lifestyle  Maintain a healthy weight. Extra weight puts stress on your back and makes it difficult to have good posture.  Avoid activities or situations that make you feel anxious or stressed. Stress and anxiety increase muscle   tension and can make back pain worse. Learn ways to manage anxiety and stress, such as through exercise. General instructions  Sleep on a firm mattress in a comfortable position. Try lying on your side with your knees slightly bent. If you lie on your back, put a pillow under your knees.  Follow your treatment plan as told by your health care provider. This may include: ? Cognitive or behavioral therapy. ? Acupuncture or massage therapy. ? Meditation or yoga. Contact a health care provider if:  You have pain that is not relieved with rest or medicine.  You have increasing pain going down  into your legs or buttocks.  Your pain does not improve after 2 weeks.  You have pain at night.  You lose weight without trying.  You have a fever or chills. Get help right away if:  You develop new bowel or bladder control problems.  You have unusual weakness or numbness in your arms or legs.  You develop nausea or vomiting.  You develop abdominal pain.  You feel faint. Summary  Acute back pain is sudden and usually short-lived.  Use proper lifting techniques. When you bend and lift, use positions that put less stress on your back.  Take over-the-counter and prescription medicines and apply heat or ice as directed by your health care provider. This information is not intended to replace advice given to you by your health care provider. Make sure you discuss any questions you have with your health care provider. Document Revised: 01/15/2019 Document Reviewed: 05/10/2017 [employer]vier Patient Education  2020 [employer]  

## 2020-03-25 NOTE — Progress Notes (Signed)
[facility] Patient [facility] [address] [place], [place]  [address] Phone:  [phone]   Fax:  [phone]   Acute Office Visit  Subjective:    Patient ID: Erin Cantu, female    DOB: 27-Aug-1966, 54 y.o.   MRN: [id number]  Chief Complaint  Patient presents with  . Fatigue    feeling tried , fatigue , having pain in lower back, vit d     HPI Patient is in today for an acute visit. She  has a past medical history of CAD (coronary artery disease), native coronary artery, Cardiac arrest (HCC) (07/11/2018), Echocardiogram, Herpes, HLD (hyperlipidemia), Hyperlipidemia, Hypertension, STEMI (ST elevation myocardial infarction) (HCC) (07/2018), and Ventricular fibrillation (HCC).    Fatigue Patient complains of fatigue. Symptoms began several weeks ago. The patient feels the fatigue began with: Her new job. Symptoms of her fatigue have been general malaise. Patient describes the following psychological symptoms: stress at work. Patient denies change in hair texture, cold intolerance, constipation, excessive menstrual bleeding, exercise intolerance, fever, GI blood loss and unusual rashes. Symptoms have gradually worsened. Symptom severity: struggles to carry out day to day responsibilities.. Previous visits for this problem: none.   Low Back Pain Patient complains of chronic low back pain. This is evaluated as a personal injury. The patient first noted symptoms several days ago. It was related to a twisting movement and lifting a heavy object. The pain is rated mild, moderate, and is located at the right sacroiliac area. The pain is described as aching and sharp and occurs intermittently. The symptoms has been progressive. Symptoms are exacerbated by extension, flexion, lifting and twisting. Factors which relieve the pain include nothing. Other associated symptoms include no other symptoms. Previous history of symptoms: never. Treatment efforts have included prescription NSAIDS,  acetaminophen and muscle relaxers, with and without relief.    Past Medical History:  Diagnosis Date  . CAD (coronary artery disease), native coronary artery    a. 07/2018 - total occlusion of the second OM with balloon angioplasty  // Myoview 10/2019: EF 66, normal perfusion; Low Risk   . Cardiac arrest (HCC) 07/11/2018  . Echocardiogram    Echo 10/19: EF 55-60, inferolateral and inferior hypokinesis, mild to moderate mitral regurgitation, mild to moderate LAE, normal RVSF, trivial TR // Echocardiogram 10/2019: EF 60-65, Gr 1 DD, trivial MR, trivial TR   . Herpes   . HLD (hyperlipidemia)   . Hyperlipidemia   . Hypertension   . STEMI (ST elevation myocardial infarction) (HCC) 07/2018  . Ventricular fibrillation [facility])     Past Surgical History:  Procedure Laterality Date  . ABDOMINAL HYSTERECTOMY  09/2005   done for fibroids  . CORONARY BALLOON ANGIOPLASTY N/A 07/11/2018   Procedure: CORONARY BALLOON ANGIOPLASTY;  Surgeon: Tonny Bollman, MD;  Location: [facility] INVASIVE CV LAB;  Service: Cardiovascular;  Laterality: N/A;  . LEFT HEART CATH AND CORONARY ANGIOGRAPHY N/A 07/11/2018   Procedure: LEFT HEART CATH AND CORONARY ANGIOGRAPHY;  Surgeon: Tonny Bollman, MD;  Location: [facility] INVASIVE CV LAB;  Service: Cardiovascular;  Laterality: N/A;    Family History  Problem Relation Age of Onset  . Cancer Paternal Grandmother        breast  . Diabetes Maternal Grandmother   . COPD Father   . Lung cancer Father   . Stroke Mother   . Colon cancer Mother   . Cancer Brother        metastatic lung cancer  . Proteinuria Daughter     Social  History   Socioeconomic History  . Marital status: Divorced    Spouse name: Not on file  . Number of children: Not on file  . Years of education: Not on file  . Highest education level: Not on file  Occupational History  . Not on file  Tobacco Use  . Smoking status: Never Smoker  . Smokeless tobacco: Never Used  Vaping Use  . Vaping Use: Never used    Substance and Sexual Activity  . Alcohol use: No  . Drug use: No  . Sexual activity: Yes    Birth control/protection: Post-menopausal, Surgical    Comment: hysterectomy  Other Topics Concern  . Not on file  Social History Narrative  . Not on file   Social Determinants of Health   Financial Resource Strain:   . Difficulty of Paying Living Expenses:   Food Insecurity:   . Worried About Charity fundraiser in the Last Year:   . Arboriculturist in the Last Year:   Transportation Needs:   . Film/video editor (Medical):   Marland Kitchen Lack of Transportation (Non-Medical):   Physical Activity:   . Days of Exercise per Week:   . Minutes of Exercise per Session:   Stress:   . Feeling of Stress :   Social Connections:   . Frequency of Communication with Friends and Family:   . Frequency of Social Gatherings with Friends and Family:   . Attends Religious Services:   . Active Member of Clubs or Organizations:   . Attends Archivist Meetings:   Marland Kitchen Marital Status:   Intimate Partner Violence:   . Fear of Current or Ex-Partner:   . Emotionally Abused:   Marland Kitchen Physically Abused:   . Sexually Abused:     Outpatient Medications Prior to Visit  Medication Sig Dispense Refill  . acetaminophen (TYLENOL) 500 MG tablet Take 1 tablet (500 mg total) by mouth every 6 (six) hours as needed. 30 tablet 3  . atorvastatin (LIPITOR) 80 MG tablet Take 1 tablet (80 mg total) by mouth daily at 6 PM. 90 tablet 0  . clopidogrel (PLAVIX) 75 MG tablet Take 1 tablet (75 mg total) by mouth daily. 90 tablet 2  . cyclobenzaprine (FLEXERIL) 10 MG tablet Take 1 tablet (10 mg total) by mouth 3 (three) times daily. 30 tablet 3  . metoprolol tartrate (LOPRESSOR) 25 MG tablet Take 1 tablet (25 mg total) by mouth 2 (two) times daily. 180 tablet 2  . naproxen (NAPROSYN) 500 MG tablet Take 1 tablet (500 mg total) by mouth 2 (two) times daily with a meal. 60 tablet 3  . nitroGLYCERIN (NITROSTAT) 0.4 MG SL tablet Place 1  tablet (0.4 mg total) under the tongue every 5 (five) minutes x 3 doses as needed for chest pain. 25 tablet 2  . valACYclovir (VALTREX) 500 MG tablet TAKE 1 TABLET BY MOUTH DAILY. TAKE 2 TABLETS BY MOUTH DAILY X 1 WEEK FOR OUTBREAKS. (Patient not taking: Reported on 03/25/2020) 60 tablet 3   No facility-administered medications prior to visit.    No Known Allergies  Review of Systems     Objective:    Physical Exam Constitutional:      General: She is not in acute distress.    Appearance: She is obese.  HENT:     Head: Normocephalic and atraumatic.     Mouth/Throat:     Mouth: Mucous membranes are moist.  Cardiovascular:     Rate and Rhythm: Normal rate  and regular rhythm.     Pulses: Normal pulses.     Heart sounds: Normal heart sounds.  Pulmonary:     Effort: Pulmonary effort is normal.     Breath sounds: Normal breath sounds.  Musculoskeletal:        General: Tenderness present.     Cervical back: Normal range of motion.     Lumbar back: Tenderness present. Decreased range of motion.     Right lower leg: No edema.     Left lower leg: No edema.     Comments: Tenderness to right buttock area /sciatic region  Neurological:     Mental Status: She is alert.     BP 116/70 (BP Location: Left Arm, Patient Position: Sitting, Cuff Size: Large)   Pulse 74   Temp (!) 97.3 F (36.3 C) (Temporal)   Ht 5' 2.5" (1.588 m)   Wt 261 lb 6.4 oz (118.6 kg)   SpO2 100%   BMI 47.05 kg/m  Wt Readings from Last 3 Encounters:  03/25/20 261 lb 6.4 oz (118.6 kg)  01/20/20 260 lb 6.4 oz (118.1 kg)  11/04/19 263 lb (119.3 kg)    Health Maintenance Due  Topic Date Due  . COVID-19 Vaccine (1) Never done  . MAMMOGRAM  Never done  . COLONOSCOPY  Never done    There are no preventive care reminders to display for this patient.   Lab Results  Component Value Date   TSH 1.020 01/20/2020   Lab Results  Component Value Date   WBC 5.4 01/20/2020   HGB 13.0 01/20/2020   HCT 38.6  01/20/2020   MCV 88 01/20/2020   PLT 228 01/20/2020   Lab Results  Component Value Date   NA 142 01/20/2020   K 3.9 01/20/2020   CO2 23 01/20/2020   GLUCOSE 101 (H) 01/20/2020   BUN 9 01/20/2020   CREATININE 0.85 01/20/2020   BILITOT 0.4 01/20/2020   ALKPHOS 140 (H) 01/20/2020   AST 18 01/20/2020   ALT 17 01/20/2020   PROT 6.7 01/20/2020   ALBUMIN 4.3 01/20/2020   CALCIUM 9.1 01/20/2020   ANIONGAP 7 07/12/2018   Lab Results  Component Value Date   CHOL 119 01/20/2020   Lab Results  Component Value Date   HDL 50 01/20/2020   Lab Results  Component Value Date   LDLCALC 58 01/20/2020   Lab Results  Component Value Date   TRIG 43 01/20/2020   Lab Results  Component Value Date   CHOLHDL 2.4 01/20/2020   Lab Results  Component Value Date   HGBA1C 6.1 (A) 01/20/2020       Assessment & Plan:   Problem List Items Addressed This Visit    None    Visit Diagnoses    Acute Midline low back pain without sciatica-  Primary   Relevant Orders   POCT Urinalysis Dipstick (Completed)   DG Lumbar Spine Complete W/Bend We will continue with current regimen of Flexeril and naproxen with further evaluation.  Work note provided Patient instructed if she needs an additional work note this can be provided.  She admits she will be changing jobs which may be effective in the treatment of her current back pain.   Fatigue, unspecified type       Sleep study considered due to history of MI , snoring and weight No repeat morning labs today secondary to patient's recent blood work all within normal range and there is no indication of any changes in stability.  No orders of the defined types were placed in this encounter.    Barbette Merino, NP

## 2020-03-31 ENCOUNTER — Other Ambulatory Visit: Payer: Self-pay

## 2020-03-31 ENCOUNTER — Ambulatory Visit (HOSPITAL_COMMUNITY)
Admission: RE | Admit: 2020-03-31 | Discharge: 2020-03-31 | Disposition: A | Discharge status: Home / Self Care | Payer: Self-pay | Source: Ambulatory Visit | Attending: Nurse Practitioner | Admitting: Nurse Practitioner

## 2020-03-31 DIAGNOSIS — M545 Low back pain, unspecified: Secondary | ICD-10-CM

## 2020-04-16 ENCOUNTER — Telehealth: Payer: Self-pay | Admitting: Family Medicine

## 2020-04-17 ENCOUNTER — Telehealth: Payer: Self-pay | Admitting: Family Medicine

## 2020-04-17 NOTE — Telephone Encounter (Signed)
Called Pt several times and left message to schedule appointment  with Jolene Schimke

## 2020-04-17 NOTE — Telephone Encounter (Signed)
Done

## 2020-04-20 ENCOUNTER — Ambulatory Visit (INDEPENDENT_AMBULATORY_CARE_PROVIDER_SITE_OTHER): Payer: Self-pay | Admitting: Family Medicine

## 2020-04-20 ENCOUNTER — Encounter: Payer: Self-pay | Admitting: Family Medicine

## 2020-04-20 ENCOUNTER — Other Ambulatory Visit: Payer: Self-pay

## 2020-04-20 ENCOUNTER — Ambulatory Visit: Payer: Self-pay | Admitting: Family Medicine

## 2020-04-20 DIAGNOSIS — Z09 Encounter for follow-up examination after completed treatment for conditions other than malignant neoplasm: Secondary | ICD-10-CM

## 2020-04-20 DIAGNOSIS — G8929 Other chronic pain: Secondary | ICD-10-CM

## 2020-04-20 DIAGNOSIS — M545 Low back pain, unspecified: Secondary | ICD-10-CM

## 2020-04-20 DIAGNOSIS — I10 Essential (primary) hypertension: Secondary | ICD-10-CM

## 2020-04-20 DIAGNOSIS — F419 Anxiety disorder, unspecified: Secondary | ICD-10-CM

## 2020-04-20 MED ORDER — CYCLOBENZAPRINE HCL 10 MG PO TABS: 10.0000 mg | ORAL_TABLET | Freq: Three times a day (TID) | ORAL | 3 refills | Status: DC

## 2020-04-20 MED FILL — CYCLOBENZAPRINE 10 MG TAB: 10 | 10 days supply | Qty: 30 | Fill #0

## 2020-04-20 NOTE — Progress Notes (Signed)
Virtual Visit via Telephone Note  I connected with Erin Cantu on 04/20/20 at  8:00 AM EDT by telephone and verified that I am speaking with the correct person using two identifiers.   I discussed the limitations, risks, security and privacy concerns of performing an evaluation and management service by telephone and the availability of in person appointments. I also discussed with the patient that there may be a patient responsible charge related to this service. The patient expressed understanding and agreed to proceed.   Televisit Today Patient Location: Home Provider Location: Office   History of Present Illness: Past Surgical History:  Procedure Laterality Date  . ABDOMINAL HYSTERECTOMY  09/2005   done for fibroids  . CORONARY BALLOON ANGIOPLASTY N/A 07/11/2018   Procedure: CORONARY BALLOON ANGIOPLASTY;  Surgeon: Tonny Bollman, MD;  Location: [facility] INVASIVE CV LAB;  Service: Cardiovascular;  Laterality: N/A;  . LEFT HEART CATH AND CORONARY ANGIOGRAPHY N/A 07/11/2018   Procedure: LEFT HEART CATH AND CORONARY ANGIOGRAPHY;  Surgeon: Tonny Bollman, MD;  Location: [facility] INVASIVE CV LAB;  Service: Cardiovascular;  Laterality: N/A;   Social History   Socioeconomic History  . Marital status: Divorced    Spouse name: Not on file  . Number of children: Not on file  . Years of education: Not on file  . Highest education level: Not on file  Occupational History  . Not on file  Tobacco Use  . Smoking status: Never Smoker  . Smokeless tobacco: Never Used  Vaping Use  . Vaping Use: Never used  Substance and Sexual Activity  . Alcohol use: No  . Drug use: No  . Sexual activity: Yes    Birth control/protection: Post-menopausal, Surgical    Comment: hysterectomy  Other Topics Concern  . Not on file  Social History Narrative  . Not on file   Social Determinants of Health   Financial Resource Strain:   . Difficulty of Paying Living Expenses:   Food Insecurity:   . Worried About  Programme researcher, broadcasting/film/video in the Last Year:   . Barista in the Last Year:   Transportation Needs:   . Freight forwarder (Medical):   Marland Kitchen Lack of Transportation (Non-Medical):   Physical Activity:   . Days of Exercise per Week:   . Minutes of Exercise per Session:   Stress:   . Feeling of Stress :   Social Connections:   . Frequency of Communication with Friends and Family:   . Frequency of Social Gatherings with Friends and Family:   . Attends Religious Services:   . Active Member of Clubs or Organizations:   . Attends Banker Meetings:   Marland Kitchen Marital Status:   Intimate Partner Violence:   . Fear of Current or Ex-Partner:   . Emotionally Abused:   Marland Kitchen Physically Abused:   . Sexually Abused:     Family History  Problem Relation Age of Onset  . Cancer Paternal Grandmother        breast  . Diabetes Maternal Grandmother   . COPD Father   . Lung cancer Father   . Stroke Mother   . Colon cancer Mother   . Cancer Brother        metastatic lung cancer  . Proteinuria Daughter     Past Medical History:  Diagnosis Date  . CAD (coronary artery disease), native coronary artery    a. 07/2018 - total occlusion of the second OM with balloon angioplasty  // Myoview 10/2019: EF  66, normal perfusion; Low Risk   . Cardiac arrest (HCC) 07/11/2018  . Echocardiogram    Echo 10/19: EF 55-60, inferolateral and inferior hypokinesis, mild to moderate mitral regurgitation, mild to moderate LAE, normal RVSF, trivial TR // Echocardiogram 10/2019: EF 60-65, Gr 1 DD, trivial MR, trivial TR   . Herpes   . HLD (hyperlipidemia)   . Hyperlipidemia   . Hypertension   . STEMI (ST elevation myocardial infarction) (HCC) 07/2018  . Ventricular fibrillation (HCC)     No Known Allergies   Patient Active Problem List   Diagnosis Date Noted  . Nausea 10/18/2019  . Coronavirus infection 10/18/2019  . Chronic left shoulder pain 07/16/2019  . Coronary artery disease involving native coronary  artery of native heart without angina pectoris 03/11/2019  . History of ST elevation myocardial infarction (STEMI) 01/13/2019  . Heart palpitations 01/13/2019  . Gastroesophageal reflux disease without esophagitis 01/13/2019  . Herpes 01/13/2019  . Hyperlipidemia 07/13/2018  . Hypertension 07/13/2018  . Hx of Inf STEMI in 07/2018 07/11/2018    Current Status: Since her last office visit, she continues to have right lower back pain X months. She reports that she only gets that pain once she attempts to stand and lingers for about 5-10 minutes. She is having difficulties performing her job duties. She is currently not taking any medications for pain relief at this time. She denies fevers, chills, fatigue, recent infections, weight loss, and night sweats. She has not had any headaches, visual changes, dizziness, and falls. No chest pain, heart palpitations, cough and shortness of breath reported. Denies GI problems such as nausea, vomiting, diarrhea, and constipation. Sje has no reports of blood in stools, dysuria and hematuria. No depression or anxiety, and denies suicidal ideations, homicidal ideations, or auditory hallucinations. She is taking all medications as prescribed. she denies pain today.     Observations/Objective:  Telephone Visit    Assessment and Plan:  1. Chronic right-sided low back pain without sciatica - cyclobenzaprine (FLEXERIL) 10 MG tablet; Take 1 tablet (10 mg total) by mouth 3 (three) times daily.  Dispense: 30 tablet; Refill: 3 - CT Lumbar Spine Wo Contrast; Future  2. Low back pain without sciatica, unspecified back pain laterality, unspecified chronicity - CT Lumbar Spine Wo Contrast; Future  3. Essential hypertension She will continue to take medications as prescribed, to decrease high sodium intake, excessive alcohol intake, increase potassium intake, smoking cessation, and increase physical activity of at least 30 minutes of cardio activity daily. She will  continue to follow Heart Healthy or DASH diet.  4. Anxiety Stable today.   5. Follow up She will follow up in 3 months.   Meds ordered this encounter  Medications  . cyclobenzaprine (FLEXERIL) 10 MG tablet    Sig: Take 1 tablet (10 mg total) by mouth 3 (three) times daily.    Dispense:  30 tablet    Refill:  3    Orders Placed This Encounter  Procedures  . CT Lumbar Spine Wo Contrast    Referral Orders  No referral(s) requested today    Raliegh Ip,  MSN, FNP-BC [facility] [address] [place], [place] [address] [phone]- fax      I discussed the assessment and treatment plan with the patient. The patient was provided an opportunity to ask questions and all were answered. The patient agreed with the plan and demonstrated an understanding of the instructions.  The patient was advised to call back or seek an in-person evaluation if the symptoms worsen or if the condition fails to improve as anticipated.  I provided 20 minutes of non-face-to-face time during this encounter.   Erin Glatter, FNP

## 2020-04-27 MED FILL — ?CLOPIDOGREL 75MG TA: 75 | 30 days supply | Qty: 30 | Fill #3

## 2020-04-27 MED FILL — ATORVASTATIN 80 MG TABLET: 80 | 30 days supply | Qty: 30 | Fill #0

## 2020-04-28 MED FILL — CYCLOBENZAPRINE 10 MG TAB: 10 | 10 days supply | Qty: 30 | Fill #0

## 2020-05-25 ENCOUNTER — Ambulatory Visit: Payer: Self-pay | Admitting: Physician Assistant

## 2020-05-25 ENCOUNTER — Telehealth: Payer: Self-pay | Admitting: Cardiovascular Disease

## 2020-05-25 NOTE — Telephone Encounter (Signed)
Erin Cantu is returning [place] call.

## 2020-05-25 NOTE — Telephone Encounter (Signed)
New message:     Patient calling stating she has been sore in the middle of her chest, patient also a little sore this morning not as bad as yesterday. Patient also would like to see the doctor. I offered patient a appt for tomorrow that doesn't work for her.

## 2020-05-25 NOTE — Telephone Encounter (Signed)
Returned the patients call. She reports that she has had a "sore chest" since Saturday night. She denies any chest pain, chest pressure, SOB, N/V, swelling or radiating pain. She does not think that she has done anything to cause her chest muscles to be sore. She says this does NOT feel like her heart attack pain when she experienced it in 2019. Offered the patient an appt tomorrow morning but she is unable to attend. Advised the patient to follow up with PCP as this sounds musculoskeletal and to call us back if any new symptoms present.

## 2020-05-25 NOTE — Telephone Encounter (Signed)
Left message for patient to call back  

## 2020-05-26 ENCOUNTER — Telehealth: Payer: Self-pay | Admitting: *Deleted

## 2020-05-26 ENCOUNTER — Encounter: Payer: Self-pay | Admitting: Physician Assistant

## 2020-05-26 ENCOUNTER — Other Ambulatory Visit: Payer: Self-pay

## 2020-05-26 ENCOUNTER — Ambulatory Visit (INDEPENDENT_AMBULATORY_CARE_PROVIDER_SITE_OTHER): Payer: Self-pay | Admitting: Physician Assistant

## 2020-05-26 VITALS — BP 120/84 | HR 61 | Ht 62.5 in | Wt 264.0 lb

## 2020-05-26 DIAGNOSIS — E782 Mixed hyperlipidemia: Secondary | ICD-10-CM

## 2020-05-26 DIAGNOSIS — I10 Essential (primary) hypertension: Secondary | ICD-10-CM

## 2020-05-26 DIAGNOSIS — R0683 Snoring: Secondary | ICD-10-CM

## 2020-05-26 DIAGNOSIS — I25118 Atherosclerotic heart disease of native coronary artery with other forms of angina pectoris: Secondary | ICD-10-CM

## 2020-05-26 NOTE — Patient Instructions (Addendum)
Medication Instructions:  Your physician recommends that you continue on your current medications as directed. Please refer to the Current Medication list given to you today.  *If you need a refill on your cardiac medications before your next appointment, please call your pharmacy*   Lab Work: None ordered  If you have labs (blood work) drawn today and your tests are completely normal, you will receive your results only by: Marland Kitchen MyChart Message (if you have MyChart) OR . A paper copy in the mail If you have any lab test that is abnormal or we need to change your treatment, we will call you to review the results.   Testing/Procedures: Your physician has recommended that you have a sleep study. (they will call to arrange this) This test records several body functions during sleep, including: brain activity, eye movement, oxygen and carbon dioxide blood levels, heart rate and rhythm, breathing rate and rhythm, the flow of air through your mouth and nose, snoring, body muscle movements, and chest and belly movement.     Follow-Up: At [facility], you and your health needs are our priority.  As part of our continuing mission to provide you with exceptional heart care, we have created designated Provider Care Teams.  These Care Teams include your primary Cardiologist (physician) and Advanced Practice Providers (APPs -  Physician Assistants and Nurse Practitioners) who all work together to provide you with the care you need, when you need it.  We recommend signing up for the patient portal called "MyChart".  Sign up information is provided on this After Visit Summary.  MyChart is used to connect with patients for Virtual Visits (Telemedicine).  Patients are able to view lab/test results, encounter notes, upcoming appointments, etc.  Non-urgent messages can be sent to your provider as well.   To learn more about what you can do with MyChart, go to [email or web address].    Your next appointment:    3 month(s)  The format for your next appointment:   In Person  Provider:   You may see Tonny Bollman, MD or one of the following Advanced Practice Providers on your designated Care Team:    Tereso Newcomer, [place]     Other Instructions

## 2020-05-26 NOTE — Telephone Encounter (Signed)
-----   Message from Elliot Cousin, [place] sent at 05/26/2020 12:12 PM EDT ----- Regarding: SLEEP STUDY Pt needs a sleep study.Epiworthness scale has been answered in Epic.  Pt has no insurance. Thanks!

## 2020-05-26 NOTE — Telephone Encounter (Signed)
Staff message sent to Coralee North ok to schedule sleep study. Per Victorino Dike Witty's request message patient has no Insurance.

## 2020-05-26 NOTE — Telephone Encounter (Signed)
Patient is scheduled for lab study on 06/19/20. Patient understands her sleep study will be done at [facility] lab. Patient understands she will receive a sleep packet in a week or so. Patient understands to call if she does not receive the sleep packet in a timely manner. Patient agrees with treatment and thanked me for call.

## 2020-05-26 NOTE — Progress Notes (Signed)
Cardiology Office Note    Date:  05/26/2020   ID:  Erin Cantu, DOB 1966/06/25, MRN [id number]  PCP:  Kallie Locks, FNP  Cardiologist: Dr. Excell Seltzer   Chief Complaint: Sore Chest   History of Present Illness:   Erin Cantu is a 54 y.o. female with hx of Coronary artery disease s/p POBA to OM2 (too small for stent), HTN, HLD, palpitation and COVID 19 in 09/2019 seen for chest soreness.   Hx of anterior STEMI in October 2019. She suffered VF arrest that was witnessed and treated promptly. She was found to have an occluded OM2 that was too small for stenting and she was treated with balloon angioplasty alone. LVEF was preserved and post-PCI was uneventful. At the time of her hospital follow-up visit, she complained of shortness of breath. An echocardiogram showed akinesis of the inferolateral wall with an LVEF of 55-60%.  Seen by Tereso Newcomer October 25, 2019 for chest pain and shortness of breath after diagnosed with COVID-19 in December.  Follow-up stress test was low risk study with normal perfusion.  Echocardiogram showed preserved LV function grade 1 diastolic dysfunction.  No wall motion abnormality.  No further work-up recommended.  Added to my schedule for chest soreness.  On Saturday patient had an episode of upper sternal chest soreness which exacerbated Sunday.  She took Tylenol 1000 mg which improved her symptoms but never resolved.  She did not tried sublingual nitroglycerin.  Denies associated shortness of breath, nausea or vomiting.  May have fluttering sensation but unsure.  Reports snoring at night and daytime tiredness.  Never tested for sleep apnea.  She is planning to start a keto diet.  Past Medical History:  Diagnosis Date   CAD (coronary artery disease), native coronary artery    a. 07/2018 - total occlusion of the second OM with balloon angioplasty  // Myoview 10/2019: EF 66, normal perfusion; Low Risk    Cardiac arrest (HCC) 07/11/2018   Echocardiogram    Echo  10/19: EF 55-60, inferolateral and inferior hypokinesis, mild to moderate mitral regurgitation, mild to moderate LAE, normal RVSF, trivial TR // Echocardiogram 10/2019: EF 60-65, Gr 1 DD, trivial MR, trivial TR    Herpes    HLD (hyperlipidemia)    Hyperlipidemia    Hypertension    STEMI (ST elevation myocardial infarction) (HCC) 07/2018   Ventricular fibrillation (HCC)     Past Surgical History:  Procedure Laterality Date   ABDOMINAL HYSTERECTOMY  09/2005   done for fibroids   CORONARY BALLOON ANGIOPLASTY N/A 07/11/2018   Procedure: CORONARY BALLOON ANGIOPLASTY;  Surgeon: Tonny Bollman, MD;  Location[facility] INVASIVE CV LAB;  Service: Cardiovascular;  Laterality: N/A;   LEFT HEART CATH AND CORONARY ANGIOGRAPHY N/A 07/11/2018   Procedure: LEFT HEART CATH AND CORONARY ANGIOGRAPHY;  Surgeon: Tonny Bollman, MD;  Location: [facility] INVASIVE CV LAB;  Service: Cardiovascular;  Laterality: N/A;    Current Medications: Prior to Admission medications   Medication Sig Start Date End Date Taking? Authorizing Provider  acetaminophen (TYLENOL) 500 MG tablet Take 1 tablet (500 mg total) by mouth every 6 (six) hours as needed. 07/19/19   Kallie Locks, FNP  atorvastatin (LIPITOR) 80 MG tablet Take 1 tablet (80 mg total) by mouth daily at 6 PM. 01/20/20   Kallie Locks, FNP  clopidogrel (PLAVIX) 75 MG tablet Take 1 tablet (75 mg total) by mouth daily. 01/20/20   Kallie Locks, FNP  cyclobenzaprine (FLEXERIL) 10 MG tablet Take 1 tablet (10 mg  total) by mouth 3 (three) times daily. 04/20/20   Kallie Locks, FNP  metoprolol tartrate (LOPRESSOR) 25 MG tablet Take 1 tablet (25 mg total) by mouth 2 (two) times daily. 01/20/20   Kallie Locks, FNP  naproxen (NAPROSYN) 500 MG tablet Take 1 tablet (500 mg total) by mouth 2 (two) times daily with a meal. Patient not taking: Reported on 04/20/2020 01/20/20   Kallie Locks, FNP  nitroGLYCERIN (NITROSTAT) 0.4 MG SL tablet Place 1 tablet (0.4 mg  total) under the tongue every 5 (five) minutes x 3 doses as needed for chest pain. 07/13/18   Arty Baumgartner, NP  valACYclovir (VALTREX) 500 MG tablet TAKE 1 TABLET BY MOUTH DAILY. TAKE 2 TABLETS BY MOUTH DAILY X 1 WEEK FOR OUTBREAKS. Patient not taking: Reported on 03/25/2020 07/23/19   Kallie Locks, FNP    Allergies:   Patient has no known allergies.   Social History   Socioeconomic History   Marital status: Divorced    Spouse name: Not on file   Number of children: Not on file   Years of education: Not on file   Highest education level: Not on file  Occupational History   Not on file  Tobacco Use   Smoking status: Never Smoker   Smokeless tobacco: Never Used  Vaping Use   Vaping Use: Never used  Substance and Sexual Activity   Alcohol use: No   Drug use: No   Sexual activity: Yes    Birth control/protection: Post-menopausal, Surgical    Comment: hysterectomy  Other Topics Concern   Not on file  Social History Narrative   Not on file   Social Determinants of Health   Financial Resource Strain:    Difficulty of Paying Living Expenses:   Food Insecurity:    Worried About Programme researcher, broadcasting/film/video in the Last Year:    Barista in the Last Year:   Transportation Needs:    Freight forwarder (Medical):    Lack of Transportation (Non-Medical):   Physical Activity:    Days of Exercise per Week:    Minutes of Exercise per Session:   Stress:    Feeling of Stress :   Social Connections:    Frequency of Communication with Friends and Family:    Frequency of Social Gatherings with Friends and Family:    Attends Religious Services:    Active Member of Clubs or Organizations:    Attends Engineer, structural:    Marital Status:      Family History:  The patient's family history includes COPD in her father; Cancer in her brother and paternal grandmother; Colon cancer in her mother; Diabetes in her maternal grandmother; Lung  cancer in her father; Proteinuria in her daughter; Stroke in her mother.   ROS:   Please see the history of present illness.    ROS All other systems reviewed and are negative.   PHYSICAL EXAM:   VS:  BP 120/84    Pulse 61    Ht 5' 2.5" (1.588 m)    Wt 264 lb (119.7 kg)    SpO2 97%    BMI 47.52 kg/m    GEN: Well nourished, well developed, in no acute distress  HEENT: normal  Neck: no JVD, carotid bruits, or masses Cardiac: RRR; no murmurs, rubs, or gallops,no edema  Respiratory:  clear to auscultation bilaterally, normal work of breathing GI: soft, nontender, nondistended, + BS MS: no deformity or atrophy  Skin:  warm and dry, no rash Neuro:  Alert and Oriented x 3, Strength and sensation are intact Psych: euthymic mood, full affect  Wt Readings from Last 3 Encounters:  05/26/20 264 lb (119.7 kg)  03/25/20 261 lb 6.4 oz (118.6 kg)  01/20/20 260 lb 6.4 oz (118.1 kg)      Studies/Labs Reviewed:   EKG:  EKG is not  ordered today.    Recent Labs: 10/25/2019: NT-Pro BNP 143 01/20/2020: ALT 17; BUN 9; Creatinine, Ser 0.85; Hemoglobin 13.0; Platelets 228; Potassium 3.9; Sodium 142; TSH 1.020   Lipid Panel    Component Value Date/Time   CHOL 119 01/20/2020 1551   TRIG 43 01/20/2020 1551   HDL 50 01/20/2020 1551   CHOLHDL 2.4 01/20/2020 1551   CHOLHDL 3.4 07/12/2018 0634   VLDL 12 07/12/2018 0634   LDLCALC 58 01/20/2020 1551    Additional studies/ records that were reviewed today include:   Echocardiogram: 11/07/19 1. Left ventricular ejection fraction, by visual estimation, is 60 to  65%. The left ventricle has normal function. There is no left ventricular  hypertrophy.  2. Left ventricular diastolic parameters are consistent with Grade I  diastolic dysfunction (impaired relaxation).  3. The left ventricle has no regional wall motion abnormalities.  4. Global right ventricle has normal systolic function.The right  ventricular size is normal.  5. Left atrial size was  normal.  6. Right atrial size was normal.  7. The mitral valve is normal in structure. Trivial mitral valve  regurgitation. No evidence of mitral stenosis.  8. The tricuspid valve is normal in structure. Tricuspid valve  regurgitation is trivial.  9. The aortic valve is normal in structure. Aortic valve regurgitation is  not visualized. No evidence of aortic valve sclerosis or stenosis.  10. The pulmonic valve was normal in structure. Pulmonic valve  regurgitation is not visualized.  11. The inferior vena cava is normal in size with greater than 50%  respiratory variability, suggesting right atrial pressure of 3 mmHg.  12. Normal LV systolic function; grade 1 diastolic dysfunction.   Stress test 10/2019  The left ventricular ejection fraction is hyperdynamic (>65%).  Nuclear stress EF: 66%.  The study is normal.  This is a low risk study.  There was no ST segment deviation noted during stress.  No T wave inversion was noted during stress.   Low risk stress nuclear study with normal perfusion and normal left ventricular regional and global systolic function.      ASSESSMENT & PLAN:    1. Chest shortness Her symptoms is reproducible with palpation by exam.  Reassuring stress test and echocardiogram beginning of this year.  Advised to try Tylenol.  She will take vital sign with recurrent symptoms to see her heart rate and blood pressure. No change in medications.   2.  Snoring and daytime tiredness -Wants to do sleep study.  3.  Hypertension Blood pressure stable on current medication.  4.  CAD -Continue Plavix, Lopressor and Lipitor.    Medication Adjustments/Labs and Tests Ordered: Current medicines are reviewed at length with the patient today.  Concerns regarding medicines are outlined above.  Medication changes, Labs and Tests ordered today are listed in the Patient Instructions below. Patient Instructions  Medication Instructions:  Your physician  recommends that you continue on your current medications as directed. Please refer to the Current Medication list given to you today.  *If you need a refill on your cardiac medications before your next appointment, please call your pharmacy*  Lab Work: None ordered  If you have labs (blood work) drawn today and your tests are completely normal, you will receive your results only by:  MyChart Message (if you have MyChart) OR  A paper copy in the mail If you have any lab test that is abnormal or we need to change your treatment, we will call you to review the results.   Testing/Procedures: Your physician has recommended that you have a sleep study. (they will call to arrange this) This test records several body functions during sleep, including: brain activity, eye movement, oxygen and carbon dioxide blood levels, heart rate and rhythm, breathing rate and rhythm, the flow of air through your mouth and nose, snoring, body muscle movements, and chest and belly movement.     Follow-Up: At [facility], you and your health needs are our priority.  As part of our continuing mission to provide you with exceptional heart care, we have created designated Provider Care Teams.  These Care Teams include your primary Cardiologist (physician) and Advanced Practice Providers (APPs -  Physician Assistants and Nurse Practitioners) who all work together to provide you with the care you need, when you need it.  We recommend signing up for the patient portal called "MyChart".  Sign up information is provided on this After Visit Summary.  MyChart is used to connect with patients for Virtual Visits (Telemedicine).  Patients are able to view lab/test results, encounter notes, upcoming appointments, etc.  Non-urgent messages can be sent to your provider as well.   To learn more about what you can do with MyChart, go to [email or web address].    Your next appointment:   3 month(s)  The format for your next  appointment:   In Person  Provider:   You may see Tonny Bollman, MD or one of the following Advanced Practice Providers on your designated Care Team:    Tereso Newcomer, PA-C     Other Instructions      Signed, Manson Passey, [place]  05/26/2020 12:06 PM    [facility] [address] [place], [place], [place]  [address] Phone: [phone]; Fax: [phone]

## 2020-05-26 NOTE — Telephone Encounter (Signed)
-----   Message from Gaynelle Cage, CMA sent at 05/26/2020  3:35 PM EDT ----- Regarding: RE: SLEEP STUDY Ok to schedule.  ----- Message ----- From: Elliot Cousin, RMA Sent: 05/26/2020  12:12 PM EDT To: Cv Div Sleep Studies Subject: SLEEP STUDY                                    Pt needs a sleep study.Epiworthness scale has been answered in Epic.  Pt has no insurance. Thanks!

## 2020-05-28 MED FILL — ATORVASTATIN 80 MG TABLET: 80 | 30 days supply | Qty: 30 | Fill #1

## 2020-05-28 MED FILL — ?CLOPIDOGREL 75MG TA: 75 | 30 days supply | Qty: 30 | Fill #4

## 2020-06-29 MED FILL — VALACYCLOVIR HCL 500 MG TAB: 500 | 30 days supply | Qty: 30 | Fill #5

## 2020-06-29 MED FILL — ATORVASTATIN CALCIUM 80 MG: 80 | 30 days supply | Qty: 30 | Fill #2

## 2020-06-29 MED FILL — ?CLOPIDOGREL 75MG TA: 75 | 30 days supply | Qty: 30 | Fill #5

## 2020-07-15 ENCOUNTER — Other Ambulatory Visit: Payer: Self-pay

## 2020-07-15 ENCOUNTER — Encounter: Payer: Self-pay | Admitting: Family Medicine

## 2020-07-15 ENCOUNTER — Ambulatory Visit (INDEPENDENT_AMBULATORY_CARE_PROVIDER_SITE_OTHER): Payer: Self-pay | Admitting: Family Medicine

## 2020-07-15 VITALS — BP 134/74 | HR 66 | Temp 97.7°F | Resp 16 | Ht 62.0 in | Wt 255.8 lb

## 2020-07-15 DIAGNOSIS — Z09 Encounter for follow-up examination after completed treatment for conditions other than malignant neoplasm: Secondary | ICD-10-CM

## 2020-07-15 DIAGNOSIS — R8290 Unspecified abnormal findings in urine: Secondary | ICD-10-CM

## 2020-07-15 DIAGNOSIS — M545 Low back pain, unspecified: Secondary | ICD-10-CM

## 2020-07-15 DIAGNOSIS — Z202 Contact with and (suspected) exposure to infections with a predominantly sexual mode of transmission: Secondary | ICD-10-CM

## 2020-07-15 DIAGNOSIS — F419 Anxiety disorder, unspecified: Secondary | ICD-10-CM

## 2020-07-15 DIAGNOSIS — I10 Essential (primary) hypertension: Secondary | ICD-10-CM

## 2020-07-15 DIAGNOSIS — G8929 Other chronic pain: Secondary | ICD-10-CM

## 2020-07-15 LAB — POCT URINALYSIS DIPSTICK
Bilirubin, UA: NEGATIVE
Blood, UA: NEGATIVE
Glucose, UA: NEGATIVE
Ketones, UA: NEGATIVE
Nitrite, UA: NEGATIVE
Protein, UA: NEGATIVE
Spec Grav, UA: 1.02 (ref 1.010–1.025)
Urobilinogen, UA: 1 E.U./dL
pH, UA: 7 (ref 5.0–8.0)

## 2020-07-15 NOTE — Progress Notes (Signed)
[facility]    Established Patient Office Visit  Subjective:  Patient ID: Erin Cantu, female    DOB: 1966-03-28  Age: 53 y.o. MRN: [id number]  CC:  Chief Complaint  Patient presents with  . Follow-up    Pt states she wants STI testing. Pt states she  wiped herself on 07/12/20 and she saw some blood. Pt states slight odor. P t also states she is having some thigh pain that travels down to her leg.X2wks.  Pt also states she was @ work yesterday and her L eye went w/o sight for 3 min then vision came back burry.     HPI Erin Cantu is a 54 year old female who presents for Follow Up today.    Patient Active Problem List   Diagnosis Date Noted  . Nausea 10/18/2019  . Coronavirus infection 10/18/2019  . Chronic left shoulder pain 07/16/2019  . Coronary artery disease involving native coronary artery of native heart without angina pectoris 03/11/2019  . History of ST elevation myocardial infarction (STEMI) 01/13/2019  . Heart palpitations 01/13/2019  . Gastroesophageal reflux disease without esophagitis 01/13/2019  . Herpes 01/13/2019  . Hyperlipidemia 07/13/2018  . Hypertension 07/13/2018  . Hx of Inf STEMI in 07/2018 07/11/2018   Current Status: Since her last office visit, she has c/o vaginal pain, scant bleeding, and urinary frequency X 1 week. She has not taken any medication for relief of her symptoms. She also has c/o left leg tingling/burning/weakness sensation X a few months now. Recently brief loss of vision X 3-5 minutes in her left eye X 1-2 days, which she . She denies upper extremity pain/weakness sensation. Her anxiety is moderate today r/t possible STD and her sexual partner. She denies fevers, chills, fatigue, recent infections, weight loss, and night sweats. She has not had any headaches, dizziness, and falls. No chest pain, heart palpitations, cough and shortness of breath reported. Denies GI problems such as nausea,  vomiting, diarrhea, and constipation. She has no reports of blood in stools, dysuria and hematuria. She is taking all medications as prescribed.   Past Medical History:  Diagnosis Date  . CAD (coronary artery disease), native coronary artery    a. 07/2018 - total occlusion of the second OM with balloon angioplasty  // Myoview 10/2019: EF 66, normal perfusion; Low Risk   . Cardiac arrest (HCC) 07/11/2018  . Echocardiogram    Echo 10/19: EF 55-60, inferolateral and inferior hypokinesis, mild to moderate mitral regurgitation, mild to moderate LAE, normal RVSF, trivial TR // Echocardiogram 10/2019: EF 60-65, Gr 1 DD, trivial MR, trivial TR   . Herpes   . HLD (hyperlipidemia)   . Hyperlipidemia   . Hypertension   . STEMI (ST elevation myocardial infarction) (HCC) 07/2018  . Ventricular fibrillation [facility])     Past Surgical History:  Procedure Laterality Date  . ABDOMINAL HYSTERECTOMY  09/2005   done for fibroids  . CORONARY BALLOON ANGIOPLASTY N/A 07/11/2018   Procedure: CORONARY BALLOON ANGIOPLASTY;  Surgeon: Tonny Bollman, MD;  Location: [facility] INVASIVE CV LAB;  Service: Cardiovascular;  Laterality: N/A;  . LEFT HEART CATH AND CORONARY ANGIOGRAPHY N/A 07/11/2018   Procedure: LEFT HEART CATH AND CORONARY ANGIOGRAPHY;  Surgeon: Tonny Bollman, MD;  Location: [facility] INVASIVE CV LAB;  Service: Cardiovascular;  Laterality: N/A;    Family History  Problem Relation Age of Onset  . Cancer Paternal Grandmother        breast  . Diabetes Maternal Grandmother   .  COPD Father   . Lung cancer Father   . Stroke Mother   . Colon cancer Mother   . Cancer Brother        metastatic lung cancer  . Proteinuria Daughter     Social History   Socioeconomic History  . Marital status: Divorced    Spouse name: Not on file  . Number of children: Not on file  . Years of education: Not on file  . Highest education level: Not on file  Occupational History  . Not on file  Tobacco Use  . Smoking status: Never  Smoker  . Smokeless tobacco: Never Used  Vaping Use  . Vaping Use: Never used  Substance and Sexual Activity  . Alcohol use: No  . Drug use: No  . Sexual activity: Yes    Birth control/protection: Post-menopausal, Surgical    Comment: hysterectomy  Other Topics Concern  . Not on file  Social History Narrative  . Not on file   Social Determinants of Health   Financial Resource Strain:   . Difficulty of Paying Living Expenses: Not on file  Food Insecurity:   . Worried About Programme researcher, broadcasting/film/video in the Last Year: Not on file  . Ran Out of Food in the Last Year: Not on file  Transportation Needs:   . Lack of Transportation (Medical): Not on file  . Lack of Transportation (Non-Medical): Not on file  Physical Activity:   . Days of Exercise per Week: Not on file  . Minutes of Exercise per Session: Not on file  Stress:   . Feeling of Stress : Not on file  Social Connections:   . Frequency of Communication with Friends and Family: Not on file  . Frequency of Social Gatherings with Friends and Family: Not on file  . Attends Religious Services: Not on file  . Active Member of Clubs or Organizations: Not on file  . Attends Banker Meetings: Not on file  . Marital Status: Not on file  Intimate Partner Violence:   . Fear of Current or Ex-Partner: Not on file  . Emotionally Abused: Not on file  . Physically Abused: Not on file  . Sexually Abused: Not on file    Outpatient Medications Prior to Visit  Medication Sig Dispense Refill  . acetaminophen (TYLENOL) 500 MG tablet Take 1 tablet (500 mg total) by mouth every 6 (six) hours as needed. 30 tablet 3  . atorvastatin (LIPITOR) 80 MG tablet Take 1 tablet (80 mg total) by mouth daily at 6 PM. 90 tablet 0  . clopidogrel (PLAVIX) 75 MG tablet Take 1 tablet (75 mg total) by mouth daily. 90 tablet 2  . metoprolol tartrate (LOPRESSOR) 25 MG tablet Take 1 tablet (25 mg total) by mouth 2 (two) times daily. 180 tablet 2  .  nitroGLYCERIN (NITROSTAT) 0.4 MG SL tablet Place 1 tablet (0.4 mg total) under the tongue every 5 (five) minutes x 3 doses as needed for chest pain. (Patient not taking: Reported on 07/15/2020) 25 tablet 2  . valACYclovir (VALTREX) 500 MG tablet TAKE 1 TABLET BY MOUTH DAILY. TAKE 2 TABLETS BY MOUTH DAILY X 1 WEEK FOR OUTBREAKS. (Patient not taking: Reported on 07/15/2020) 60 tablet 3   No facility-administered medications prior to visit.    No Known Allergies  ROS Review of Systems  Constitutional: Negative.   HENT: Negative.   Eyes: Negative.   Respiratory: Negative.   Cardiovascular: Negative.   Gastrointestinal: Positive for abdominal distention (obese).  Endocrine: Negative.   Genitourinary: Positive for vaginal discharge and vaginal pain.  Musculoskeletal: Negative.   Skin: Negative.   Allergic/Immunologic: Negative.   Neurological: Positive for dizziness (occasional).  Hematological: Negative.   Psychiatric/Behavioral: The patient is nervous/anxious.       Objective:    Physical Exam Vitals and nursing note reviewed.  Constitutional:      Appearance: Normal appearance.  HENT:     Head: Normocephalic and atraumatic.     Nose: Nose normal.     Mouth/Throat:     Mouth: Mucous membranes are moist.     Pharynx: Oropharynx is clear.  Cardiovascular:     Rate and Rhythm: Normal rate and regular rhythm.     Pulses: Normal pulses.     Heart sounds: Normal heart sounds.  Pulmonary:     Effort: Pulmonary effort is normal.     Breath sounds: Normal breath sounds.  Abdominal:     General: Bowel sounds are normal. There is distension (obese).     Palpations: Abdomen is soft.  Musculoskeletal:        General: Normal range of motion.     Cervical back: Normal range of motion.  Skin:    General: Skin is warm and dry.  Neurological:     General: No focal deficit present.     Mental Status: She is alert and oriented to person, place, and time.  Psychiatric:        Mood and  Affect: Mood normal.        Behavior: Behavior normal.        Thought Content: Thought content normal.     BP 134/74 (BP Location: Left Arm, Patient Position: Sitting, Cuff Size: Large)   Pulse 66   Temp 97.7 F (36.5 C)   Resp 16   Ht 5\' 2"  (1.575 m)   Wt 255 lb 12.8 oz (116 kg)   SpO2 100%   BMI 46.79 kg/m  Wt Readings from Last 3 Encounters:  07/15/20 255 lb 12.8 oz (116 kg)  05/26/20 264 lb (119.7 kg)  03/25/20 261 lb 6.4 oz (118.6 kg)     Health Maintenance Due  Topic Date Due  . COVID-19 Vaccine (1) Never done  . MAMMOGRAM  Never done  . COLONOSCOPY  Never done  . INFLUENZA VACCINE  Never done    There are no preventive care reminders to display for this patient.  Lab Results  Component Value Date   TSH 1.020 01/20/2020   Lab Results  Component Value Date   WBC 5.4 01/20/2020   HGB 13.0 01/20/2020   HCT 38.6 01/20/2020   MCV 88 01/20/2020   PLT 228 01/20/2020   Lab Results  Component Value Date   NA 142 01/20/2020   K 3.9 01/20/2020   CO2 23 01/20/2020   GLUCOSE 101 (H) 01/20/2020   BUN 9 01/20/2020   CREATININE 0.85 01/20/2020   BILITOT 0.4 01/20/2020   ALKPHOS 140 (H) 01/20/2020   AST 18 01/20/2020   ALT 17 01/20/2020   PROT 6.7 01/20/2020   ALBUMIN 4.3 01/20/2020   CALCIUM 9.1 01/20/2020   ANIONGAP 7 07/12/2018   Lab Results  Component Value Date   CHOL 119 01/20/2020   Lab Results  Component Value Date   HDL 50 01/20/2020   Lab Results  Component Value Date   LDLCALC 58 01/20/2020   Lab Results  Component Value Date   TRIG 43 01/20/2020   Lab Results  Component Value Date  CHOLHDL 2.4 01/20/2020   Lab Results  Component Value Date   HGBA1C 6.1 (A) 01/20/2020    Assessment & Plan:   1. Possible exposure to STD - NuSwab Vaginitis Plus (VG+) - STD Screen (8)  2. Anxiety  3. Chronic right-sided low back pain without sciatica  4. Essential hypertension The current medical regimen is effective; blood pressure is  stable at 134/74 today; continue present plan and medications as prescribed. She will continue to take medications as prescribed, to decrease high sodium intake, excessive alcohol intake, increase potassium intake, smoking cessation, and increase physical activity of at least 30 minutes of cardio activity daily. She will continue to follow Heart Healthy or DASH diet. - Urinalysis Dipstick  5. Abnormal finding in urine - Urine Culture  6. Follow up She will follow up in 6 months.   No orders of the defined types were placed in this encounter.   Orders Placed This Encounter  Procedures  . Urine Culture  . NuSwab Vaginitis Plus (VG+)  . STD Screen (8)  . Urinalysis Dipstick    Referral Orders  No referral(s) requested today    Raliegh IpNatalie Canaan Holzer,  MSN, FNP-BC [facility] [address]  [place], [place] [address] [phone]   Problem List Items Addressed This Visit    None    Visit Diagnoses    Possible exposure to STD    -  Primary   Relevant Orders   NuSwab Vaginitis Plus (VG+) (Completed)   STD Screen (8) (Completed)   Anxiety       Chronic right-sided low back pain without sciatica       Essential hypertension       Relevant Orders   Urinalysis Dipstick (Completed)   Abnormal finding in urine       Relevant Orders   Urine Culture (Completed)   Follow up          No orders of the defined types were placed in this encounter.   Follow-up: Return in about 6 months (around 01/13/2021).    Kallie LocksNatalie M Zohal Reny, FNP

## 2020-07-16 LAB — STD SCREEN (8)
HIV Screen 4th Generation wRfx: NONREACTIVE
HSV 1 Glycoprotein G Ab, IgG: 46.1 index — ABNORMAL HIGH (ref 0.00–0.90)
HSV 2 IgG, Type Spec: >23.6 index — ABNORMAL HIGH (ref 0.00–0.90)
Hep A IgM: NEGATIVE
Hep B C IgM: NEGATIVE
Hep C Virus Ab: 0.1 s/co ratio (ref 0.0–0.9)
Hepatitis B Surface Ag: NEGATIVE
RPR Ser Ql: NONREACTIVE

## 2020-07-17 ENCOUNTER — Telehealth: Payer: Self-pay | Admitting: Family Medicine

## 2020-07-17 LAB — URINE CULTURE

## 2020-07-17 NOTE — Telephone Encounter (Signed)
DOne

## 2020-07-18 LAB — NUSWAB VAGINITIS PLUS (VG+)
Candida albicans, NAA: NEGATIVE
Candida glabrata, NAA: NEGATIVE
Chlamydia trachomatis, NAA: NEGATIVE
Neisseria gonorrhoeae, NAA: NEGATIVE
Trich vag by NAA: NEGATIVE

## 2020-07-20 ENCOUNTER — Telehealth: Payer: Self-pay | Admitting: Family Medicine

## 2020-07-20 ENCOUNTER — Telehealth: Payer: Self-pay

## 2020-07-20 NOTE — Telephone Encounter (Signed)
Done

## 2020-07-21 ENCOUNTER — Ambulatory Visit: Payer: Self-pay | Admitting: Family Medicine

## 2020-07-21 ENCOUNTER — Telehealth: Payer: Self-pay | Admitting: Family Medicine

## 2020-07-21 ENCOUNTER — Encounter: Payer: Self-pay | Admitting: Family Medicine

## 2020-07-21 NOTE — Telephone Encounter (Signed)
Recent swab negative for STD. Patient states that she had similar symptoms and was positive a few years ago. Patient advised to report to our office for nurse visit for retesting at any time during our office hours. Patient states that she does not want to take time off work for visit. Patient advised to report to Urgent Care if needed. Patient also,advised to follow up with GYN for further assessment of ongoing problem if needed.

## 2020-07-22 ENCOUNTER — Telehealth: Payer: Self-pay | Admitting: Family Medicine

## 2020-07-22 NOTE — Telephone Encounter (Signed)
Attempted to contact patient today, to assess symptoms and schedule for STD re-check. Unable to leave voice message.

## 2020-07-29 MED FILL — ?CLOPIDOGREL 75MG TA: 75 | 30 days supply | Qty: 30 | Fill #6

## 2020-07-29 MED FILL — ATORVASTATIN CALCIUM 80 MG: 80 | 30 days supply | Qty: 30 | Fill #3

## 2020-08-17 ENCOUNTER — Ambulatory Visit: Payer: Self-pay | Admitting: Physician Assistant

## 2020-08-25 ENCOUNTER — Other Ambulatory Visit: Payer: Self-pay

## 2020-08-25 ENCOUNTER — Ambulatory Visit: Payer: Self-pay

## 2020-08-25 DIAGNOSIS — Z021 Encounter for pre-employment examination: Secondary | ICD-10-CM

## 2020-08-25 LAB — POCT URINE DRUG SCREEN
POC Amphetamine UR: NOT DETECTED
POC Barbiturate UR: NOT DETECTED
POC Cocaine UR: NOT DETECTED
POC Methamphetamine UR: NOT DETECTED
POC PHENCYCLIDINE UR: NOT DETECTED
URINE TEMPERATURE: 94 Degrees F (ref 90.0–100.0)

## 2020-08-28 ENCOUNTER — Other Ambulatory Visit: Payer: Self-pay | Admitting: Family Medicine

## 2020-08-28 DIAGNOSIS — B009 Herpesviral infection, unspecified: Secondary | ICD-10-CM

## 2020-08-28 MED FILL — ATORVASTATIN CALCIUM 80 MG: 80 | 30 days supply | Qty: 30 | Fill #4

## 2020-08-28 MED FILL — ?CLOPIDOGREL 75MG TA: 75 | 30 days supply | Qty: 30 | Fill #7

## 2020-09-17 ENCOUNTER — Other Ambulatory Visit: Payer: Self-pay | Admitting: Family Medicine

## 2020-09-17 MED FILL — VALACYCLOVIR HCL 500 MG TAB: 500 | 30 days supply | Qty: 30 | Fill #0

## 2020-09-28 MED FILL — VALACYCLOVIR HCL 500 MG TAB: 500 | 30 days supply | Qty: 30 | Fill #0

## 2020-09-28 MED FILL — ?METOPROLOL TARTRATE 25MG T: 25 | 30 days supply | Qty: 60 | Fill #2

## 2020-09-28 MED FILL — ?CLOPIDOGREL 75MG TA: 75 | 30 days supply | Qty: 30 | Fill #8

## 2020-10-06 ENCOUNTER — Other Ambulatory Visit: Payer: Self-pay

## 2020-10-06 ENCOUNTER — Ambulatory Visit (INDEPENDENT_AMBULATORY_CARE_PROVIDER_SITE_OTHER): Payer: Self-pay | Admitting: Physician Assistant

## 2020-10-06 ENCOUNTER — Encounter: Payer: Self-pay | Admitting: Physician Assistant

## 2020-10-06 VITALS — BP 138/82 | HR 66 | Ht 62.0 in | Wt 254.0 lb

## 2020-10-06 DIAGNOSIS — I10 Essential (primary) hypertension: Secondary | ICD-10-CM

## 2020-10-06 DIAGNOSIS — R072 Precordial pain: Secondary | ICD-10-CM

## 2020-10-06 DIAGNOSIS — I2510 Atherosclerotic heart disease of native coronary artery without angina pectoris: Secondary | ICD-10-CM

## 2020-10-06 DIAGNOSIS — E782 Mixed hyperlipidemia: Secondary | ICD-10-CM

## 2020-10-06 NOTE — Patient Instructions (Signed)
Medication Instructions:  Your physician has recommended you make the following change in your medication:   1) Start Pepcid 20 mg, 1 tablet by mouth twice a day for 3-4 weeks, then take as needed  *If you need a refill on your cardiac medications before your next appointment, please call your pharmacy*  Lab Work: None ordered today  Testing/Procedures: None ordered today  Follow-Up: At [facility], you and your health needs are our priority.  As part of our continuing mission to provide you with exceptional heart care, we have created designated Provider Care Teams.  These Care Teams include your primary Cardiologist (physician) and Advanced Practice Providers (APPs -  Physician Assistants and Nurse Practitioners) who all work together to provide you with the care you need, when you need it.  Your next appointment:   6 month(s)  The format for your next appointment:   In Person  Provider:   Tonny Bollman, MD  Other Instructions If chest pain episodes continue despite taking Pepcid for 3-4 weeks, call the office.

## 2020-10-06 NOTE — Progress Notes (Signed)
Cardiology Office Note:    Date:  10/06/2020   ID:  Erin Cantu, DOB 02-19-1966, MRN [id number]  PCP:  Azzie Glatter, FNP  [facility] Cardiologist:  Sherren Mocha, MD  [facility] Electrophysiologist:  None   Referring MD: Azzie Glatter, FNP   Chief Complaint:  Follow-up (CAD)    Patient Profile:    Erin Cantu is a 54 y.o. female with:   Coronary artery disease ? s/pinferior STEMI in 10/19c/b VFarrest>> s/p POBA to OM2 (too small for stent) ? Ticagrelor ? to Clopidogrel 2/2 bruising  Palpitations  Hyperlipidemia  Hypertension  Hx of COVID-19 in 10/26/2019  Prior CV studies: Echocardiogram 10/30/2019 EF 60-65, GR 1 DD, no RWMA, normal RVSF, trivial MR, trivial TR  Myoview 11/07/2019 EF 66, normal perfusion, low risk study  Echo 08/02/18 EF 55-60, inf-lat and in HK, mild to mod MR, mild to mod LAE, RVSP 28, trivial TR  Cardiac catheterization 07/11/2018 LM normal LCx normal; OM 2 100-thrombotic RCA normal EF 55-65, inf HK PCI: POBA to the OM2 with residual 25 1. Widely patent/angiographically normal left main, RCA, and LAD 2. 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 Mild contraction abnormality of the left ventricle with preserved overall LVEF  Nuclear stress test5/18/2005 IMPRESSION  No convincing evidence of myocardial ischemia or infarction. The computer generated polar map suggests anterior reversibility, not seen on the images. Wall motion is normal throughout and the QGSEF measures 67 percent.   History of Present Illness:    Erin Cantu was last seen by me in 10/2019.  She had been dx with COVID-19 in 2019/10/26.  Both of her parents died from COVID-70.  The patient was slow to recover and had symptoms of chest pain.  BNP, CRP, ESR were all normal.  An echocardiogram demonstrated normal LV function.   Nuclear stress test was low risk.  She had a f/u with Leanor Kail, PA-C in 8/21 and noted symptoms of chest soreness. She was felt to have MSK pain and acetaminophen was recommended.  She was also set up for a sleep study due to a hx of snoring.  This was scheduled in September 2021 but it was canceled.  She returns for follow-up. She is here alone. She continues to have episodes of chest discomfort. She has had these off and on since her heart attack. There has really not been any significant change in the pattern. It does not seem to be related to exertion. She cannot really relate it to meals. She has not really had significant shortness of breath with exertion. She has not had syncope, orthopnea or significant leg edema. She does note chest soreness for several days after an episode of chest discomfort. She has not tried nitroglycerin for it. Of note, she did try keto diet in the past. She had a lot of GI issues related to this. This all resolved when she came off of the keto diet.  Past Medical History:  Diagnosis Date  . CAD (coronary artery disease), native coronary artery    a. 07/2018 - total occlusion of the second OM with balloon angioplasty  // Myoview 10/2019: EF 66, normal perfusion; Low Risk   . Cardiac arrest ([place]) 07/11/2018  . Echocardiogram    Echo 10/19: EF 55-60, inferolateral and inferior hypokinesis, mild to moderate mitral regurgitation, mild to moderate LAE, normal RVSF, trivial TR //  Echocardiogram 10/2019: EF 60-65, Gr 1 DD, trivial MR, trivial TR   . Herpes   . HLD (hyperlipidemia)   . Hyperlipidemia   . Hypertension   . STEMI (ST elevation myocardial infarction) ([place]) 07/2018  . Ventricular fibrillation (HCC)     Current Medications: Current Meds  Medication Sig  . acetaminophen (TYLENOL) 500 MG tablet Take 1 tablet (500 mg total) by mouth every 6 (six) hours as needed.  Marland Kitchen atorvastatin (LIPITOR) 80 MG tablet Take 1 tablet (80 mg total) by mouth daily at 6 PM.   . clopidogrel (PLAVIX) 75 MG tablet Take 1 tablet (75 mg total) by mouth daily.  . metoprolol tartrate (LOPRESSOR) 25 MG tablet Take 1 tablet (25 mg total) by mouth 2 (two) times daily.  . nitroGLYCERIN (NITROSTAT) 0.4 MG SL tablet Place 1 tablet (0.4 mg total) under the tongue every 5 (five) minutes x 3 doses as needed for chest pain.  . valACYclovir (VALTREX) 500 MG tablet TAKE 1 TABLET BY MOUTH DAILY.     Allergies:   Patient has no known allergies.   Social History   Tobacco Use  . Smoking status: Never Smoker  . Smokeless tobacco: Never Used  Vaping Use  . Vaping Use: Never used  Substance Use Topics  . Alcohol use: No  . Drug use: No     Family Hx: The patient's family history includes COPD in her father; Cancer in her brother and paternal grandmother; Colon cancer in her mother; Diabetes in her maternal grandmother; Lung cancer in her father; Proteinuria in her daughter; Stroke in her mother.  ROS   EKGs/Labs/Other Test Reviewed:    EKG:  EKG is  ordered today.  The ekg ordered today demonstrates normal sinus rhythm, heart rate 66, normal axis, no ST-T wave changes, QTC 410  Recent Labs: 10/25/2019: NT-Pro BNP 143 01/20/2020: ALT 17; BUN 9; Creatinine, Ser 0.85; Hemoglobin 13.0; Platelets 228; Potassium 3.9; Sodium 142; TSH 1.020   Recent Lipid Panel Lab Results  Component Value Date/Time   CHOL 119 01/20/2020 03:51 PM   TRIG 43 01/20/2020 03:51 PM   HDL 50 01/20/2020 03:51 PM   CHOLHDL 2.4 01/20/2020 03:51 PM   CHOLHDL 3.4 07/12/2018 06:34 AM   LDLCALC 58 01/20/2020 03:51 PM      Risk Assessment/Calculations:      Physical Exam:    VS:  BP 138/82   Pulse 66   Ht 5' 2"  (1.575 m)   Wt 254 lb (115.2 kg)   SpO2 97%   BMI 46.46 kg/m     Wt Readings from Last 3 Encounters:  10/06/20 254 lb (115.2 kg)  07/15/20 255 lb 12.8 oz (116 kg)  05/26/20 264 lb (119.7 kg)     Constitutional:      Appearance: Healthy appearance. Not in distress.  Neck:      Vascular: No JVR. JVD normal.  Pulmonary:     Effort: Pulmonary effort is normal.     Breath sounds: No wheezing. No rales.  Cardiovascular:     Normal rate. Regular rhythm. Normal S1. Normal S2.     Murmurs: There is no murmur.     No rub.  Edema:    Peripheral edema absent.  Abdominal:     Palpations: Abdomen is soft. There is no hepatomegaly.  Skin:    General: Skin is warm and dry.  Neurological:     Mental Status: Alert and oriented to person, place and time.     Cranial Nerves: Cranial  nerves are intact.       ASSESSMENT & PLAN:    1. Precordial chest pain She continues to have episodes of chest discomfort. These are somewhat atypical for ischemia. I do not think that her symptoms sound consistent with microvascular angina. I reviewed her previous cardiac catheterization note. She really had no significant disease in any of the major epicardial vessels. Her infarct was caused by an occluded, small, OM2 which was treated with angioplasty. She had a Myoview in January that was low risk. Her ECG today demonstrates no significant change. I question whether or not her symptoms may be related to acid reflux. I have asked her to try famotidine 20 mg twice daily for 3-4 weeks. If she continues to have symptoms despite this, I will try her on long-acting nitrates. If she has improved symptoms with famotidine, she may need referral to gastroenterology.  2. Coronary artery disease involving native coronary artery of native heart without angina pectoris History of inferior ST elevation myocardial infarction in 78/2956 complicated by VF arrest treated with balloon angioplasty to the OM2.  She had no significant disease elsewhere. As noted, she has had intermittent chest pain since her myocardial infarction. She had a low risk Myoview in January. Her symptoms sound atypical for ischemia. Her ECG today is without acute changes. Start H2RA as outlined above. Continue clopidogrel, atorvastatin,  metoprolol tartrate. Follow-up in 6 months.  3. Mixed hyperlipidemia LDL optimal on most recent lab work.  Continue current Rx.    4. Essential hypertension Blood pressure somewhat borderline. It is usually well controlled. Her blood pressures at home are typically optimal. Continue to monitor. Continue current medications for now    Dispo:  Return in about 6 months (around 04/06/2021) for Routine Follow Up, w/ Dr. Burt Knack, or Richardson Dopp, PA-C, in person.   Medication Adjustments/Labs and Tests Ordered: Current medicines are reviewed at length with the patient today.  Concerns regarding medicines are outlined above.  Tests Ordered: Orders Placed This Encounter  Procedures  . EKG 12-Lead   Medication Changes: No orders of the defined types were placed in this encounter.   Signed, Richardson Dopp, PA-C  10/06/2020 4:45 PM    [facility] [address], [place], [place]  [address] Phone: [phone]; Fax: [phone]

## 2020-10-13 ENCOUNTER — Other Ambulatory Visit: Payer: Self-pay

## 2020-10-13 ENCOUNTER — Other Ambulatory Visit: Payer: Self-pay | Admitting: Family Medicine

## 2020-10-13 MED ORDER — PANTOPRAZOLE SODIUM 40 MG PO TBEC: 40.0000 mg | DELAYED_RELEASE_TABLET | Freq: Every day | ORAL | 6 refills | Status: AC

## 2020-10-13 MED FILL — PANTOPRAZOLE SOD DR 40 MG T: 40 | 30 days supply | Qty: 30 | Fill #0

## 2020-10-29 ENCOUNTER — Other Ambulatory Visit: Payer: Self-pay | Admitting: Family Medicine

## 2020-10-29 DIAGNOSIS — I10 Essential (primary) hypertension: Secondary | ICD-10-CM

## 2020-10-29 MED FILL — ?CLOPIDOGREL 75 MG TABL: 75 | 30 days supply | Qty: 30 | Fill #0

## 2020-11-10 ENCOUNTER — Other Ambulatory Visit: Payer: Self-pay | Admitting: Family Medicine

## 2020-11-10 MED FILL — NITROGLYCERIN 0.4 MG SUBL: 0.4 | 25 days supply | Qty: 25 | Fill #0

## 2020-11-10 NOTE — Telephone Encounter (Signed)
Please see refill request.

## 2020-11-11 ENCOUNTER — Telehealth: Payer: Self-pay | Admitting: Family Medicine

## 2020-11-11 ENCOUNTER — Telehealth (INDEPENDENT_AMBULATORY_CARE_PROVIDER_SITE_OTHER): Payer: BC Managed Care – PPO | Admitting: Family Medicine

## 2020-11-11 DIAGNOSIS — R0789 Other chest pain: Secondary | ICD-10-CM

## 2020-11-11 MED ORDER — NITROGLYCERIN 0.4 MG SL SUBL: SUBLINGUAL_TABLET | SUBLINGUAL | 0 refills | Status: AC

## 2020-11-11 NOTE — Progress Notes (Signed)
Not seen today. Patient has new PCP, Dr. Orlie Dakin.

## 2020-11-11 NOTE — Telephone Encounter (Signed)
Patient no longer seen at our clinic. New PCP is Dr. Orlie Dakin.

## 2020-11-26 MED FILL — ?CLOPIDOGREL 75 MG TABL: 75 | 30 days supply | Qty: 30 | Fill #1

## 2021-01-01 ENCOUNTER — Other Ambulatory Visit: Payer: Self-pay | Admitting: Family Medicine

## 2021-01-01 MED FILL — VALACYCLOVIR HCL 500 MG TAB: 500 | 30 days supply | Qty: 30 | Fill #1

## 2021-01-01 MED FILL — ?CLOPIDOGREL 75MG TABL: 75 | 30 days supply | Qty: 30 | Fill #2

## 2021-01-01 MED FILL — METOPROLOL TARTRATE 25 MG T: 25 | 30 days supply | Qty: 60 | Fill #3

## 2021-01-13 ENCOUNTER — Ambulatory Visit: Payer: Self-pay | Admitting: Family Medicine

## 2021-01-29 ENCOUNTER — Other Ambulatory Visit: Payer: Self-pay

## 2021-01-29 MED FILL — Atorvastatin Calcium Tab 80 MG (Base Equivalent): ORAL | 30 days supply | Qty: 30 | Fill #0 | Status: AC

## 2021-02-08 ENCOUNTER — Other Ambulatory Visit: Payer: Self-pay

## 2021-03-10 ENCOUNTER — Other Ambulatory Visit: Payer: Self-pay

## 2021-03-10 MED FILL — Atorvastatin Calcium Tab 80 MG (Base Equivalent): ORAL | 90 days supply | Qty: 90 | Fill #1 | Status: AC

## 2021-06-17 ENCOUNTER — Other Ambulatory Visit: Payer: Self-pay

## 2021-06-17 MED FILL — Atorvastatin Calcium Tab 80 MG (Base Equivalent): ORAL | 90 days supply | Qty: 90 | Fill #2 | Status: CN

## 2021-06-18 ENCOUNTER — Other Ambulatory Visit: Payer: Self-pay

## 2021-06-18 MED FILL — Atorvastatin Calcium Tab 80 MG (Base Equivalent): ORAL | 30 days supply | Qty: 30 | Fill #2 | Status: AC

## 2021-09-23 ENCOUNTER — Other Ambulatory Visit: Payer: Self-pay

## 2021-09-23 MED ORDER — CLOPIDOGREL BISULFATE 75 MG PO TABS
ORAL_TABLET | ORAL | 0 refills | Status: DC
  Filled 2021-09-23 – 2021-09-30 (×2): qty 30, 30d supply, fill #0
  Filled 2021-11-03: qty 30, 30d supply, fill #1
  Filled 2021-11-03: qty 30, 30d supply, fill #0
  Filled 2021-12-08: qty 30, 30d supply, fill #1

## 2021-09-23 MED ORDER — ATORVASTATIN CALCIUM 80 MG PO TABS
ORAL_TABLET | ORAL | 0 refills | Status: DC
  Filled 2021-09-23 – 2021-11-03 (×3): qty 30, 30d supply, fill #0
  Filled 2021-11-03 – 2021-12-08 (×2): qty 30, 30d supply, fill #1

## 2021-09-23 MED ORDER — VALACYCLOVIR HCL 500 MG PO TABS
ORAL_TABLET | ORAL | 5 refills | Status: AC
  Filled 2021-09-23 – 2021-09-30 (×2): qty 14, 14d supply, fill #0

## 2021-09-23 MED ORDER — METOPROLOL TARTRATE 25 MG PO TABS
ORAL_TABLET | ORAL | 0 refills | Status: DC
  Filled 2021-09-23 – 2021-09-30 (×2): qty 60, 30d supply, fill #0
  Filled 2021-11-03: qty 60, 30d supply, fill #1
  Filled 2021-11-03 – 2021-11-05 (×2): qty 60, 30d supply, fill #0
  Filled 2021-12-08: qty 60, 30d supply, fill #1

## 2021-09-30 ENCOUNTER — Other Ambulatory Visit: Payer: Self-pay

## 2021-10-01 ENCOUNTER — Other Ambulatory Visit: Payer: Self-pay

## 2021-11-02 IMAGING — DX DG LUMBAR SPINE COMPLETE W/ BEND
7 series · 7 of 7 positions shown · non-contrast
Comparison: None.

CLINICAL DATA: Low back and right hip pain without known injury.

EXAM:
LUMBAR SPINE - COMPLETE WITH BENDING VIEWS

[l-spine ap]
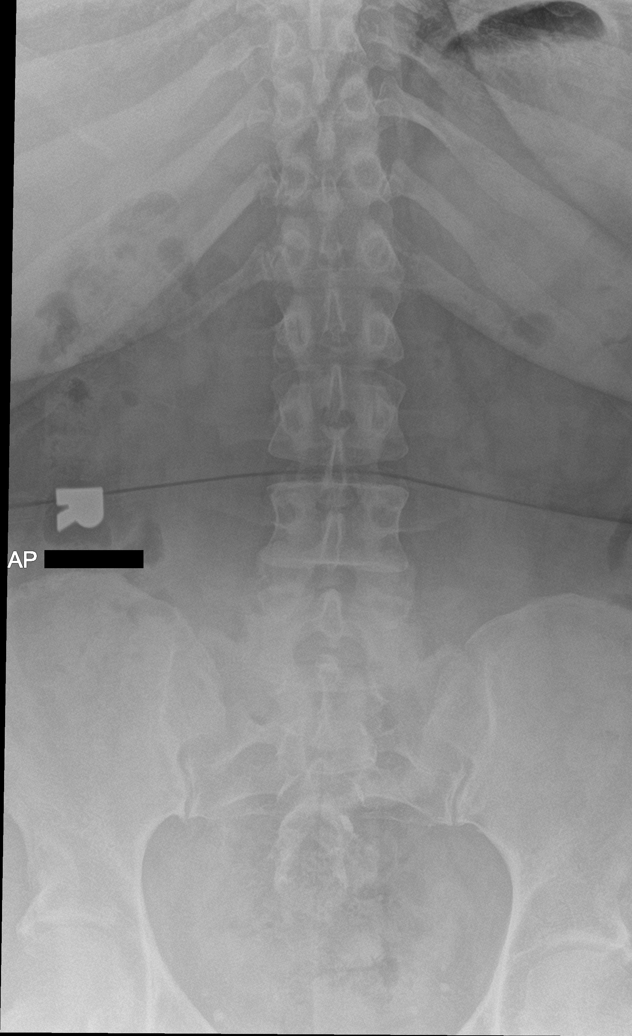

[l-spine obl (1 of 2)]
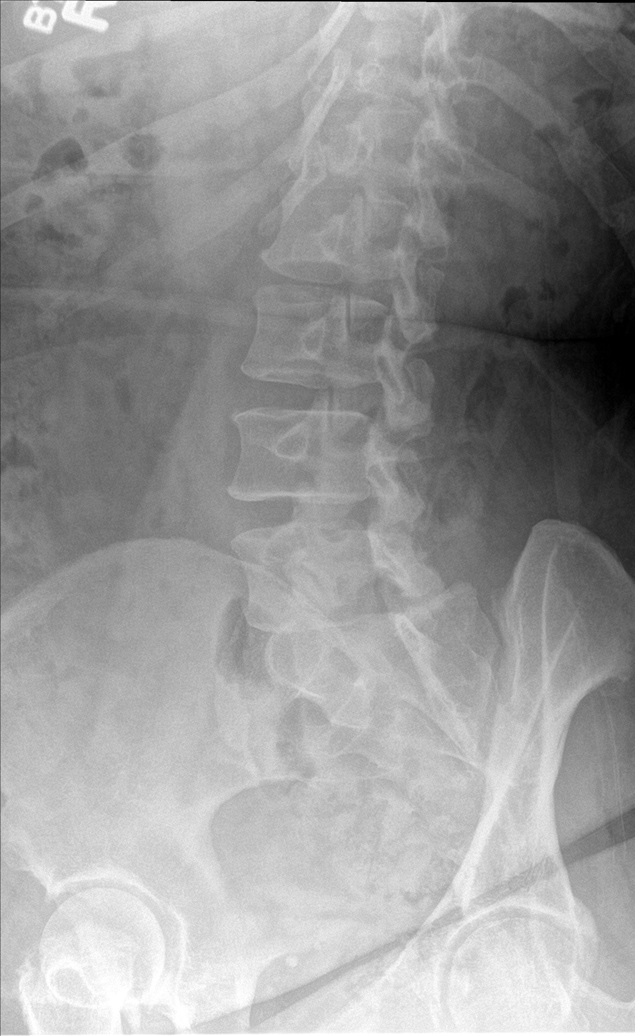

[l-spine obl (2 of 2)]
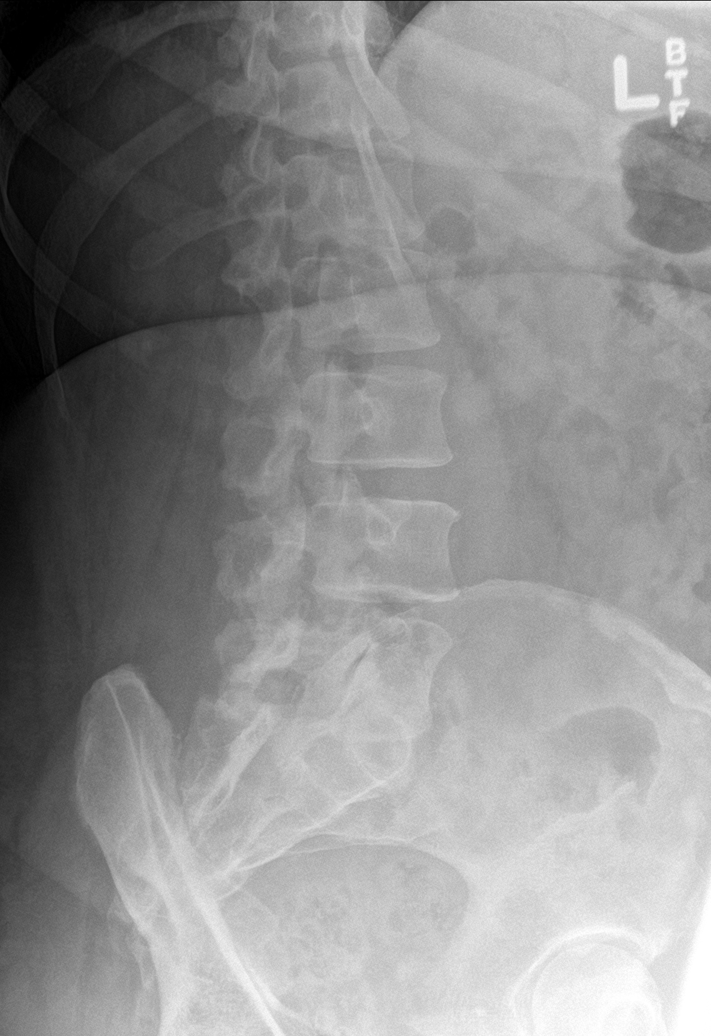

[l-spine lat]
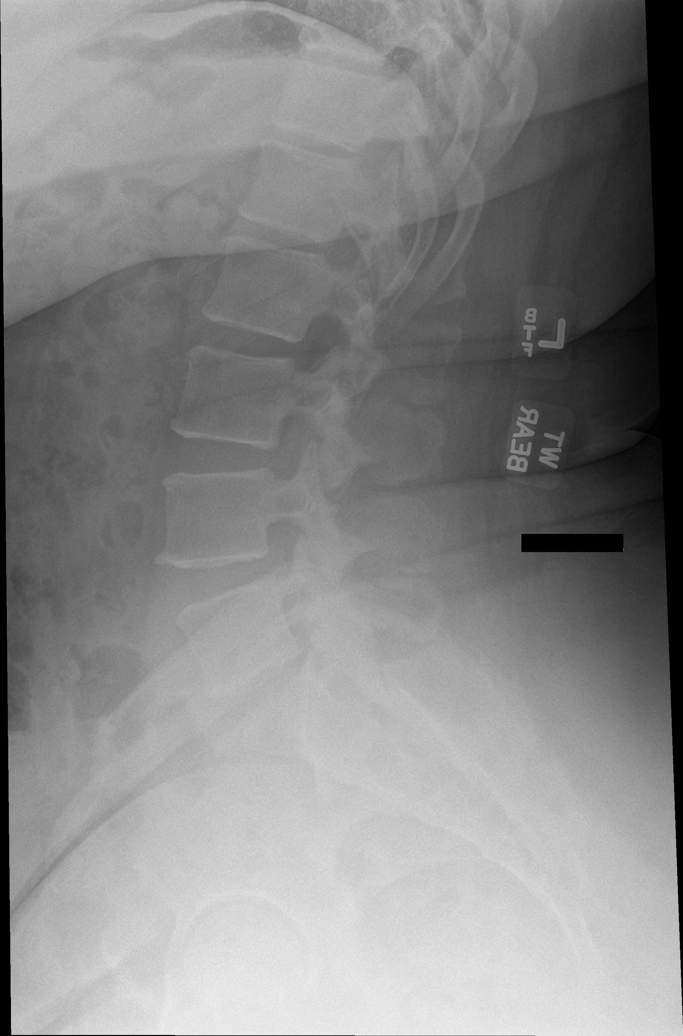

[l-spine ext]
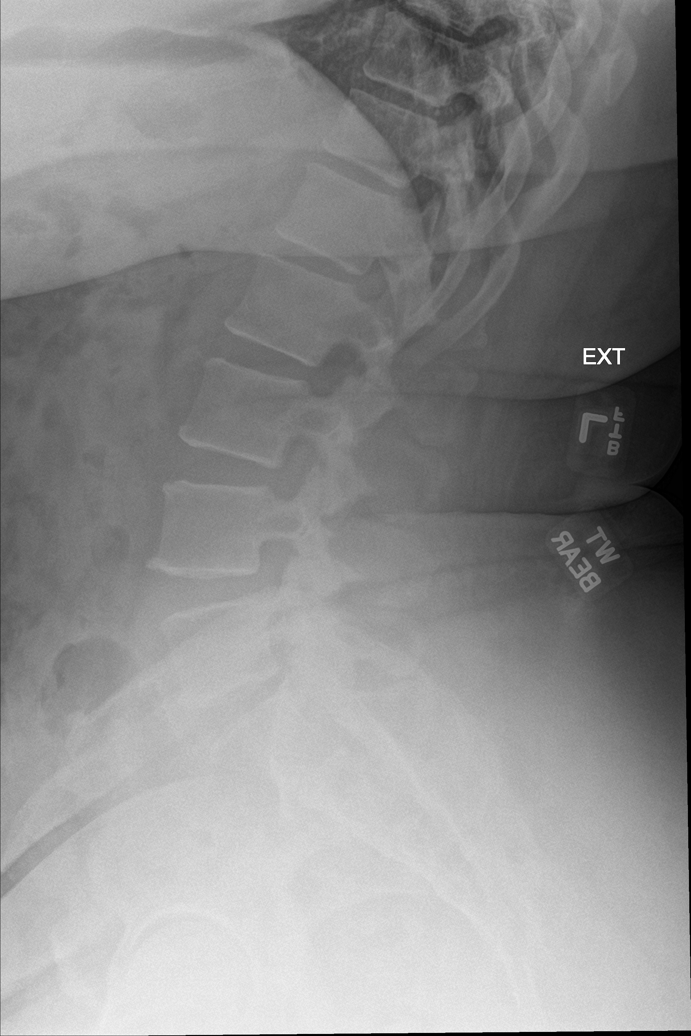

[l-spine flex]
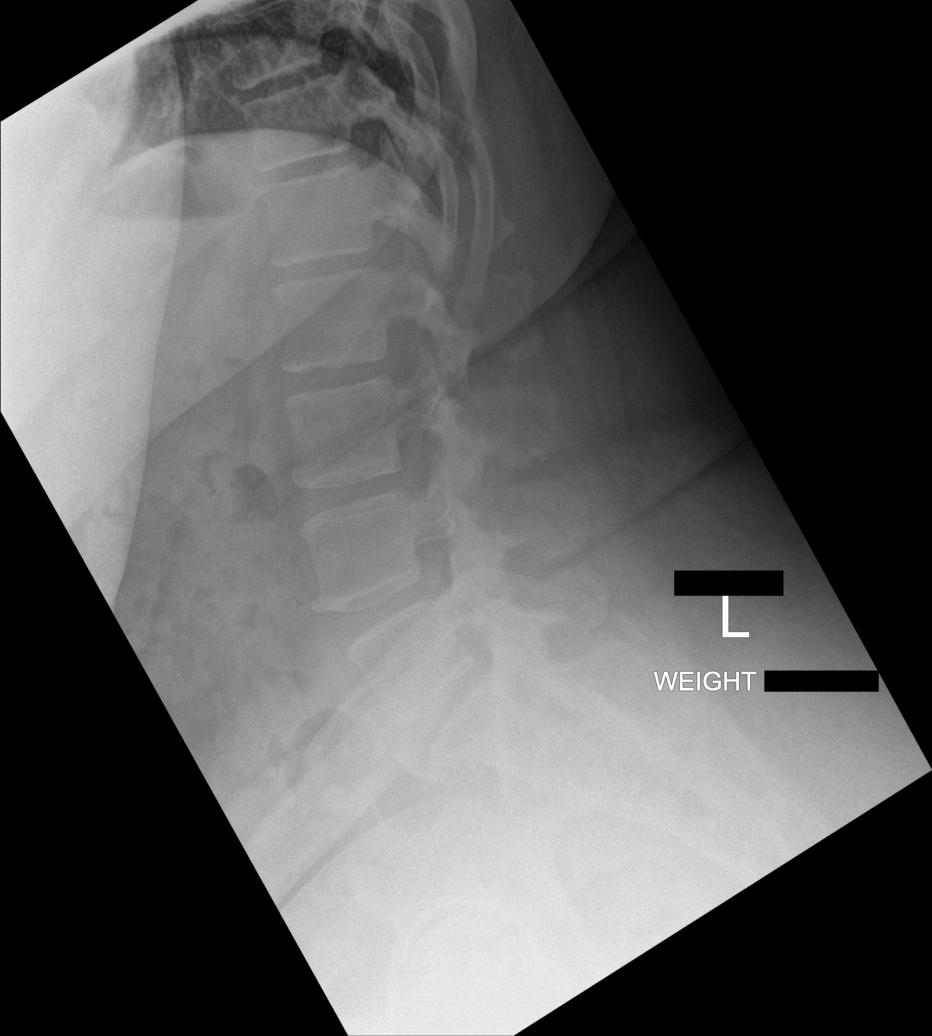

[l-spine spot]
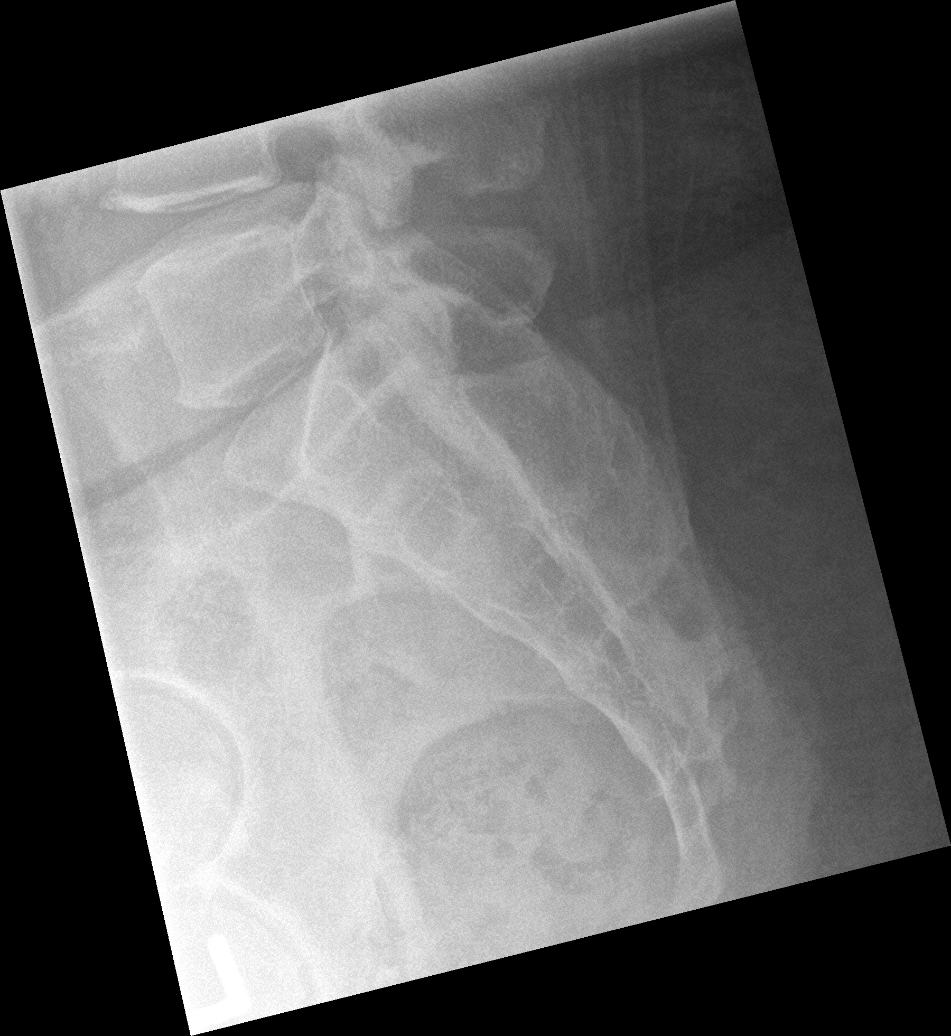

[7 of 7 positions shown; findings below may reference images not displayed]

FINDINGS: There is no evidence of lumbar spine fracture. Alignment is normal.
Intervertebral disc spaces are maintained. No change in vertebral
body alignment is noted on flexion or extension views.
IMPRESSION: Negative.

## 2021-11-03 ENCOUNTER — Other Ambulatory Visit: Payer: Self-pay

## 2021-11-05 ENCOUNTER — Other Ambulatory Visit: Payer: Self-pay

## 2021-12-08 ENCOUNTER — Other Ambulatory Visit: Payer: Self-pay

## 2021-12-09 ENCOUNTER — Other Ambulatory Visit: Payer: Self-pay

## 2022-01-06 ENCOUNTER — Other Ambulatory Visit: Payer: Self-pay

## 2022-01-06 MED ORDER — CLOPIDOGREL BISULFATE 75 MG PO TABS
ORAL_TABLET | ORAL | 0 refills | Status: AC
  Filled 2022-01-06: qty 30, 30d supply, fill #0
  Filled 2022-02-11 – 2022-03-04 (×2): qty 30, 30d supply, fill #1

## 2022-01-06 MED ORDER — ATORVASTATIN CALCIUM 80 MG PO TABS
ORAL_TABLET | ORAL | 0 refills | Status: AC
  Filled 2022-01-06: qty 30, 30d supply, fill #0
  Filled 2022-02-11 – 2022-03-04 (×2): qty 30, 30d supply, fill #1

## 2022-01-06 MED ORDER — METOPROLOL TARTRATE 25 MG PO TABS
ORAL_TABLET | ORAL | 0 refills | Status: DC
  Filled 2022-01-06: qty 60, 30d supply, fill #0
  Filled 2022-02-11: qty 60, 30d supply, fill #1
  Filled 2022-03-04: qty 60, 30d supply, fill #2

## 2022-01-10 ENCOUNTER — Other Ambulatory Visit: Payer: Self-pay

## 2022-02-11 ENCOUNTER — Other Ambulatory Visit: Payer: Self-pay

## 2022-02-25 ENCOUNTER — Other Ambulatory Visit: Payer: Self-pay

## 2022-03-04 ENCOUNTER — Other Ambulatory Visit: Payer: Self-pay

## 2022-03-08 ENCOUNTER — Other Ambulatory Visit: Payer: Self-pay | Admitting: Pharmacist

## 2022-03-08 NOTE — Chronic Care Management (AMB) (Signed)
Patient seen by Park Liter, PharmD Candidate on 03/04/22 while they were picking up prescriptions at [place] at [facility]   Patient has an automated home blood pressure machine, but have not checked at home recently.  Medication review was performed. They are taking medications as prescribed.   The following barriers to adherence were noted: - Denies concerns with medication access or understanding.  The following interventions were completed:  - Medications were reviewed - Patient was educated on medications, including indication and administration - Patient was educated on proper technique to check home blood pressure and reminded to bring home machine and readings to next provider appointment  The patient has follow up scheduled:  PCP: none, but patient plans to schedule   Catie Hedwig Morton, PharmD, [place] [phone]

## 2022-03-09 ENCOUNTER — Other Ambulatory Visit: Payer: Self-pay

## 2022-03-30 ENCOUNTER — Other Ambulatory Visit: Payer: Self-pay

## 2022-03-30 MED ORDER — ATORVASTATIN CALCIUM 80 MG PO TABS
80.0000 mg | ORAL_TABLET | Freq: Every evening | ORAL | 0 refills | Status: AC
  Filled 2022-03-30: qty 30, 30d supply, fill #0
  Filled 2022-04-06: qty 90, 90d supply, fill #0

## 2022-03-30 MED ORDER — METOPROLOL TARTRATE 25 MG PO TABS
ORAL_TABLET | ORAL | 0 refills | Status: DC
Start: 2022-03-30 — End: 2022-06-28
  Filled 2022-03-30: qty 60, 30d supply, fill #0
  Filled 2022-04-06: qty 180, 90d supply, fill #0

## 2022-03-30 MED ORDER — CLOPIDOGREL BISULFATE 75 MG PO TABS
ORAL_TABLET | ORAL | 0 refills | Status: AC
  Filled 2022-03-30: qty 30, 30d supply, fill #0
  Filled 2022-04-06: qty 90, 90d supply, fill #0

## 2022-04-05 ENCOUNTER — Other Ambulatory Visit: Payer: Self-pay

## 2022-04-06 ENCOUNTER — Other Ambulatory Visit: Payer: Self-pay

## 2022-04-26 ENCOUNTER — Other Ambulatory Visit: Payer: Self-pay

## 2022-04-26 MED ORDER — TRIAMCINOLONE ACETONIDE 0.1 % EX CREA
TOPICAL_CREAM | Freq: Two times a day (BID) | CUTANEOUS | 1 refills | Status: AC
  Filled 2022-04-26: qty 30, 15d supply, fill #0

## 2022-04-26 MED ORDER — TRIAMCINOLONE ACETONIDE 0.1 % EX CREA
TOPICAL_CREAM | Freq: Two times a day (BID) | CUTANEOUS | 1 refills | Status: AC
Start: 2022-04-26 — End: ?
  Filled 2022-04-26: qty 30, 15d supply, fill #0

## 2022-06-28 ENCOUNTER — Other Ambulatory Visit: Payer: Self-pay

## 2022-06-28 MED ORDER — CLOPIDOGREL BISULFATE 75 MG PO TABS
75.0000 mg | ORAL_TABLET | Freq: Every day | ORAL | 3 refills | Status: AC
  Filled 2022-06-28: qty 90, 90d supply, fill #0
  Filled 2022-10-06: qty 90, 90d supply, fill #1
  Filled 2022-10-07: qty 30, 30d supply, fill #1
  Filled 2022-10-11: qty 90, 90d supply, fill #1
  Filled 2023-01-12: qty 90, 90d supply, fill #2

## 2022-06-28 MED ORDER — ATORVASTATIN CALCIUM 80 MG PO TABS
80.0000 mg | ORAL_TABLET | Freq: Every day | ORAL | 3 refills | Status: AC
  Filled 2022-06-28: qty 90, 90d supply, fill #0
  Filled 2022-10-06: qty 90, 90d supply, fill #1
  Filled 2022-10-07: qty 30, 30d supply, fill #1
  Filled 2022-10-11: qty 90, 90d supply, fill #1
  Filled 2023-01-12: qty 90, 90d supply, fill #2

## 2022-06-28 MED ORDER — METOPROLOL TARTRATE 25 MG PO TABS
25.0000 mg | ORAL_TABLET | Freq: Two times a day (BID) | ORAL | 3 refills | Status: AC
Start: 2022-06-28 — End: ?
  Filled 2022-06-28: qty 180, 90d supply, fill #0
  Filled 2022-10-06: qty 60, 30d supply, fill #1
  Filled 2022-10-11: qty 180, 90d supply, fill #1
  Filled 2023-01-12: qty 180, 90d supply, fill #2

## 2022-06-29 ENCOUNTER — Other Ambulatory Visit: Payer: Self-pay

## 2022-07-05 ENCOUNTER — Other Ambulatory Visit: Payer: Self-pay

## 2022-07-11 ENCOUNTER — Other Ambulatory Visit: Payer: Self-pay

## 2022-08-30 ENCOUNTER — Other Ambulatory Visit: Payer: Self-pay

## 2022-08-30 MED ORDER — PANTOPRAZOLE SODIUM 20 MG PO TBEC
20.0000 mg | DELAYED_RELEASE_TABLET | Freq: Every day | ORAL | 1 refills | Status: AC
  Filled 2022-08-30 – 2022-10-11 (×2): qty 30, 30d supply, fill #0

## 2022-09-05 ENCOUNTER — Other Ambulatory Visit: Payer: Self-pay

## 2022-09-06 ENCOUNTER — Other Ambulatory Visit: Payer: Self-pay

## 2022-10-07 ENCOUNTER — Other Ambulatory Visit: Payer: Self-pay

## 2022-10-11 ENCOUNTER — Other Ambulatory Visit: Payer: Self-pay

## 2022-10-17 ENCOUNTER — Other Ambulatory Visit: Payer: Self-pay

## 2023-01-13 ENCOUNTER — Other Ambulatory Visit: Payer: Self-pay

## 2023-01-16 ENCOUNTER — Other Ambulatory Visit: Payer: Self-pay

## 2023-04-08 ENCOUNTER — Emergency Department (HOSPITAL_COMMUNITY): Payer: Self-pay

## 2023-04-08 ENCOUNTER — Other Ambulatory Visit: Payer: Self-pay

## 2023-04-08 ENCOUNTER — Emergency Department (HOSPITAL_COMMUNITY)
Admission: EM | Admit: 2023-04-08 | Discharge: 2023-04-08 | Disposition: A | Discharge status: Home / Self Care | Payer: Self-pay | Attending: Emergency Medicine | Admitting: Emergency Medicine

## 2023-04-08 DIAGNOSIS — I10 Essential (primary) hypertension: Secondary | ICD-10-CM | POA: Insufficient documentation

## 2023-04-08 DIAGNOSIS — R109 Unspecified abdominal pain: Secondary | ICD-10-CM | POA: Insufficient documentation

## 2023-04-08 DIAGNOSIS — I2510 Atherosclerotic heart disease of native coronary artery without angina pectoris: Secondary | ICD-10-CM | POA: Insufficient documentation

## 2023-04-08 LAB — CBC
HCT: 38.8 % (ref 36.0–46.0)
Hemoglobin: 12.5 g/dL (ref 12.0–15.0)
MCH: 28.7 pg (ref 26.0–34.0)
MCHC: 32.2 g/dL (ref 30.0–36.0)
MCV: 89 fL (ref 80.0–100.0)
Platelets: 186 10*3/uL (ref 150–400)
RBC: 4.36 MIL/uL (ref 3.87–5.11)
RDW: 13.2 % (ref 11.5–15.5)
WBC: 5.1 10*3/uL (ref 4.0–10.5)
nRBC: 0 % (ref 0.0–0.2)

## 2023-04-08 LAB — COMPREHENSIVE METABOLIC PANEL
ALT: 22 U/L (ref 0–44)
AST: 17 U/L (ref 15–41)
Albumin: 3.7 g/dL (ref 3.5–5.0)
Alkaline Phosphatase: 114 U/L (ref 38–126)
Anion gap: 8 (ref 5–15)
BUN: 9 mg/dL (ref 6–20)
CO2: 27 mmol/L (ref 22–32)
Calcium: 8.7 mg/dL — ABNORMAL LOW (ref 8.9–10.3)
Chloride: 106 mmol/L (ref 98–111)
Creatinine, Ser: 0.89 mg/dL (ref 0.44–1.00)
GFR, Estimated: >60 mL/min (ref >60)
Glucose, Bld: 134 mg/dL — ABNORMAL HIGH (ref 70–99)
Potassium: 3 mmol/L — ABNORMAL LOW (ref 3.5–5.1)
Sodium: 141 mmol/L (ref 135–145)
Total Bilirubin: 0.7 mg/dL (ref 0.3–1.2)
Total Protein: 7 g/dL (ref 6.5–8.1)

## 2023-04-08 LAB — URINALYSIS, ROUTINE W REFLEX MICROSCOPIC
Bacteria, UA: NONE SEEN
Bilirubin Urine: NEGATIVE
Glucose, UA: NEGATIVE mg/dL
Ketones, ur: NEGATIVE mg/dL
Leukocytes,Ua: NEGATIVE
Nitrite: NEGATIVE
Protein, ur: NEGATIVE mg/dL
RBC / HPF: >50 RBC/hpf (ref 0–5)
Specific Gravity, Urine: 1.019 (ref 1.005–1.030)
pH: 6 (ref 5.0–8.0)

## 2023-04-08 LAB — LIPASE, BLOOD: Lipase: 28 U/L (ref 11–51)

## 2023-04-08 MED ORDER — SODIUM CHLORIDE 0.9 % IV BOLUS
1000.0000 mL | Freq: Once | INTRAVENOUS | Status: AC
  Administered 2023-04-08: 1000 mL via INTRAVENOUS

## 2023-04-08 MED ORDER — KETOROLAC TROMETHAMINE 15 MG/ML IJ SOLN
15.0000 mg | Freq: Once | INTRAMUSCULAR | Status: AC
  Administered 2023-04-08: 15 mg via INTRAVENOUS
  Filled 2023-04-08: qty 1

## 2023-04-08 MED ORDER — HYDROMORPHONE HCL 1 MG/ML IJ SOLN
1.0000 mg | Freq: Once | INTRAMUSCULAR | Status: AC
  Administered 2023-04-08: 1 mg via INTRAVENOUS
  Filled 2023-04-08: qty 1

## 2023-04-08 NOTE — Discharge Instructions (Signed)
Drink plenty of fluids and stay hydrated and follow-up with your doctor if any problems

## 2023-04-08 NOTE — ED Provider Notes (Signed)
[facility]-EMERGENCY DEPT [facility] Emergency Department Provider Note MRN:  [id number]  Arrival date & time: 04/08/23     Chief Complaint   Flank Pain   History of Present Illness   Erin Cantu is a 57 y.o. year-old female with a history of CAD presenting to the ED with chief complaint of flank pain.  Acute right flank pain and 1:30 AM, persistent since that time.  Associated with nausea.  Some blood in the urine, urinary hesitancy.  Review of Systems  A thorough review of systems was obtained and all systems are negative except as noted in the HPI and PMH.   Patient's Health History    Past Medical History:  Diagnosis Date   CAD (coronary artery disease), native coronary artery    a. 07/2018 - total occlusion of the second OM with balloon angioplasty  // Myoview 10/2019: EF 66, normal perfusion; Low Risk    Cardiac arrest (HCC) 07/11/2018   Echocardiogram    Echo 10/19: EF 55-60, inferolateral and inferior hypokinesis, mild to moderate mitral regurgitation, mild to moderate LAE, normal RVSF, trivial TR // Echocardiogram 10/2019: EF 60-65, Gr 1 DD, trivial MR, trivial TR    Herpes    HLD (hyperlipidemia)    Hyperlipidemia    Hypertension    STEMI (ST elevation myocardial infarction) (HCC) 07/2018   Ventricular fibrillation (HCC)     Past Surgical History:  Procedure Laterality Date   ABDOMINAL HYSTERECTOMY  09/2005   done for fibroids   CORONARY BALLOON ANGIOPLASTY N/A 07/11/2018   Procedure: CORONARY BALLOON ANGIOPLASTY;  Surgeon: Tonny Bollman, MD;  Location: [facility] INVASIVE CV LAB;  Service: Cardiovascular;  Laterality: N/A;   LEFT HEART CATH AND CORONARY ANGIOGRAPHY N/A 07/11/2018   Procedure: LEFT HEART CATH AND CORONARY ANGIOGRAPHY;  Surgeon: Tonny Bollman, MD;  Location: [facility] INVASIVE CV LAB;  Service: Cardiovascular;  Laterality: N/A;    Family History  Problem Relation Age of Onset   Cancer Paternal Grandmother        breast   Diabetes Maternal Grandmother    COPD  Father    Lung cancer Father    Stroke Mother    Colon cancer Mother    Cancer Brother        metastatic lung cancer   Proteinuria Daughter     Social History   Socioeconomic History   Marital status: Divorced    Spouse name: Not on file   Number of children: Not on file   Years of education: Not on file   Highest education level: Not on file  Occupational History   Not on file  Tobacco Use   Smoking status: Never   Smokeless tobacco: Never  Vaping Use   Vaping Use: Never used  Substance and Sexual Activity   Alcohol use: No   Drug use: No   Sexual activity: Yes    Birth control/protection: Post-menopausal, Surgical    Comment: hysterectomy  Other Topics Concern   Not on file  Social History Narrative   Not on file   Social Determinants of Health   Financial Resource Strain: Not on file  Food Insecurity: Not on file  Transportation Needs: Not on file  Physical Activity: Not on file  Stress: Not on file  Social Connections: Not on file  Intimate Partner Violence: Not on file     Physical Exam   Vitals:   04/08/23 0600 04/08/23 0630  BP: 131/73 119/69  Pulse: 64 66  Resp:  16  Temp:  SpO2: 97% 91%    CONSTITUTIONAL: Well-appearing, moderate distress due to pain NEURO/PSYCH:  Alert and oriented x 3, no focal deficits EYES:  eyes equal and reactive ENT/NECK:  no LAD, no JVD CARDIO: Regular rate, well-perfused, normal S1 and S2 PULM:  CTAB no wheezing or rhonchi GI/GU:  non-distended, non-tender MSK/SPINE:  No gross deformities, no edema SKIN:  no rash, atraumatic   *Additional and/or pertinent findings included in MDM below  Diagnostic and Interventional Summary    EKG Interpretation Date/Time:  Saturday April 08 2023 06:36:27 EDT Ventricular Rate:  62 PR Interval:  181 QRS Duration:  81 QT Interval:  424 QTC Calculation: 431 R Axis:   75  Text Interpretation: Sinus rhythm Confirmed by Kennis Carina [phone]) on 04/08/2023 6:38:17 AM        Labs Reviewed  COMPREHENSIVE METABOLIC PANEL - Abnormal; Notable for the following components:      Result Value   Potassium 3.0 (*)    Glucose, Bld 134 (*)    Calcium 8.7 (*)    All other components within normal limits  CBC  LIPASE, BLOOD  URINALYSIS, ROUTINE W REFLEX MICROSCOPIC    CT RENAL STONE STUDY  Final Result      Medications  HYDROmorphone (DILAUDID) injection 1 mg (1 mg Intravenous Given 04/08/23 0552)  ketorolac (TORADOL) 15 MG/ML injection 15 mg (15 mg Intravenous Given 04/08/23 0552)  sodium chloride 0.9 % bolus 1,000 mL (1,000 mLs Intravenous New Bag/Given 04/08/23 0552)     Procedures  /  Critical Care Procedures  ED Course and Medical Decision Making  Initial Impression and Ddx Suspicious for kidney stone versus pyelonephritis versus diverticulitis versus appendicitis.  Providing pain control, awaiting labs, CT.  Past medical/surgical history that increases complexity of ED encounter: History of CAD  Interpretation of Diagnostics I personally reviewed the EKG and my interpretation is as follows: Sinus rhythm without concerning ischemic features  Labs reassuring with no significant blood count or electrolyte disturbance.  Patient Reassessment and Ultimate Disposition/Management     CT reveals small kidney stone in the bladder.  Suspect this is recently passed.  Patient is feeling a lot better on reassessment.  Still awaiting urinalysis.  Signed out to oncoming provider.  Patient management required discussion with the following services or consulting groups:  None  Complexity of Problems Addressed Acute illness or injury that poses threat of life of bodily function  Additional Data Reviewed and Analyzed Further history obtained from: Prior labs/imaging results  Additional Factors Impacting ED Encounter Risk Use of parenteral controlled substances  Elmer Sow. Pilar Plate, MD [facility] Medicine [facility]  Health [email or web address]  Final Clinical Impressions(s) / ED Diagnoses     ICD-10-CM   1. Flank pain  R10.9       ED Discharge Orders     None        Discharge Instructions Discussed with and Provided to Patient:   Discharge Instructions   None      Sabas Sous, MD 04/08/23 [phone]

## 2023-04-08 NOTE — ED Triage Notes (Signed)
Pt to ED from home c/o lower abdominal and right sided flank pain since 0130 today. Also endorses difficulty urinating due to abdominal pressure and hematuria.

## 2023-04-08 NOTE — ED Notes (Signed)
Patient aware we need Urine Sample 

## 2023-04-14 ENCOUNTER — Other Ambulatory Visit: Payer: Self-pay

## 2023-04-14 MED ORDER — CLOPIDOGREL BISULFATE 75 MG PO TABS
75.0000 mg | ORAL_TABLET | Freq: Every day | ORAL | 3 refills | Status: AC
  Filled 2023-04-14: qty 90, 90d supply, fill #0
  Filled 2023-07-05: qty 30, 30d supply, fill #1
  Filled 2023-12-21: qty 30, 30d supply, fill #2

## 2023-04-14 MED ORDER — ATORVASTATIN CALCIUM 80 MG PO TABS
80.0000 mg | ORAL_TABLET | Freq: Every day | ORAL | 3 refills | Status: AC
  Filled 2023-04-14: qty 90, 90d supply, fill #0
  Filled 2023-07-05: qty 30, 30d supply, fill #1
  Filled 2023-12-21: qty 30, 30d supply, fill #2

## 2023-04-14 MED ORDER — NITROGLYCERIN 0.4 MG SL SUBL
0.4000 mg | SUBLINGUAL_TABLET | SUBLINGUAL | 3 refills | Status: AC
  Filled 2023-04-14: qty 25, 9d supply, fill #0
  Filled 2024-01-04: qty 25, 25d supply, fill #0

## 2023-04-14 MED ORDER — METOPROLOL TARTRATE 25 MG PO TABS
25.0000 mg | ORAL_TABLET | Freq: Two times a day (BID) | ORAL | 3 refills | Status: AC
  Filled 2023-04-14: qty 180, 90d supply, fill #0
  Filled 2023-07-05: qty 60, 30d supply, fill #1
  Filled 2023-12-21: qty 60, 30d supply, fill #2

## 2023-04-19 ENCOUNTER — Other Ambulatory Visit: Payer: Self-pay

## 2023-06-05 ENCOUNTER — Other Ambulatory Visit: Payer: Self-pay

## 2023-06-06 ENCOUNTER — Other Ambulatory Visit: Payer: Self-pay

## 2023-06-06 MED ORDER — OZEMPIC (0.25 OR 0.5 MG/DOSE) 2 MG/3ML ~~LOC~~ SOPN
PEN_INJECTOR | SUBCUTANEOUS | 2 refills | Status: DC
  Filled 2023-06-06: qty 3, 30d supply, fill #0
  Filled 2023-07-28: qty 3, 28d supply, fill #0
  Filled 2023-07-28 – 2023-08-21 (×3): qty 3, 28d supply, fill #1

## 2023-06-08 ENCOUNTER — Other Ambulatory Visit: Payer: Self-pay

## 2023-06-09 ENCOUNTER — Other Ambulatory Visit: Payer: Self-pay

## 2023-06-15 ENCOUNTER — Other Ambulatory Visit: Payer: Self-pay

## 2023-06-21 ENCOUNTER — Other Ambulatory Visit: Payer: Self-pay

## 2023-07-05 ENCOUNTER — Other Ambulatory Visit: Payer: Self-pay

## 2023-07-06 ENCOUNTER — Other Ambulatory Visit: Payer: Self-pay

## 2023-07-18 ENCOUNTER — Other Ambulatory Visit: Payer: Self-pay

## 2023-07-28 ENCOUNTER — Other Ambulatory Visit: Payer: Self-pay

## 2023-07-28 ENCOUNTER — Other Ambulatory Visit (HOSPITAL_COMMUNITY): Payer: Self-pay

## 2023-08-09 ENCOUNTER — Other Ambulatory Visit: Payer: Self-pay

## 2023-08-21 ENCOUNTER — Other Ambulatory Visit: Payer: Self-pay

## 2023-09-08 ENCOUNTER — Other Ambulatory Visit: Payer: Self-pay

## 2023-09-20 ENCOUNTER — Other Ambulatory Visit: Payer: Self-pay

## 2023-09-21 ENCOUNTER — Other Ambulatory Visit: Payer: Self-pay

## 2023-09-21 MED ORDER — SEMAGLUTIDE(0.25 OR 0.5MG/DOS) 2 MG/3ML ~~LOC~~ SOPN
PEN_INJECTOR | SUBCUTANEOUS | 2 refills | Status: AC
  Filled 2023-09-21 – 2023-11-21 (×3): qty 3, 28d supply, fill #0
  Filled 2023-11-21: qty 3, 28d supply, fill #1

## 2023-10-26 ENCOUNTER — Other Ambulatory Visit (HOSPITAL_COMMUNITY): Payer: Self-pay

## 2023-10-26 ENCOUNTER — Other Ambulatory Visit: Payer: Self-pay

## 2023-10-27 ENCOUNTER — Other Ambulatory Visit: Payer: Self-pay

## 2023-11-21 ENCOUNTER — Other Ambulatory Visit (HOSPITAL_COMMUNITY): Payer: Self-pay

## 2023-11-21 ENCOUNTER — Other Ambulatory Visit: Payer: Self-pay

## 2023-11-23 ENCOUNTER — Other Ambulatory Visit (HOSPITAL_COMMUNITY): Payer: Self-pay

## 2023-12-21 ENCOUNTER — Other Ambulatory Visit: Payer: Self-pay

## 2024-01-04 ENCOUNTER — Other Ambulatory Visit: Payer: Self-pay

## 2024-01-05 ENCOUNTER — Other Ambulatory Visit: Payer: Self-pay

## 2024-01-05 MED ORDER — OZEMPIC (0.25 OR 0.5 MG/DOSE) 2 MG/3ML ~~LOC~~ SOPN
PEN_INJECTOR | SUBCUTANEOUS | 2 refills | Status: DC
  Filled 2024-01-05: qty 3, 28d supply, fill #0

## 2024-01-16 ENCOUNTER — Other Ambulatory Visit: Payer: Self-pay

## 2024-01-18 ENCOUNTER — Other Ambulatory Visit: Payer: Self-pay

## 2024-02-23 ENCOUNTER — Other Ambulatory Visit: Payer: Self-pay

## 2024-02-23 MED ORDER — NITROGLYCERIN 0.4 MG SL SUBL
0.4000 mg | SUBLINGUAL_TABLET | SUBLINGUAL | 3 refills | Status: AC | PRN
  Filled 2024-02-23: qty 25, 9d supply, fill #0

## 2024-03-06 ENCOUNTER — Other Ambulatory Visit: Payer: Self-pay

## 2024-05-06 ENCOUNTER — Encounter: Payer: Self-pay | Admitting: Cardiovascular Disease

## 2024-05-06 ENCOUNTER — Telehealth: Payer: Self-pay | Admitting: Cardiovascular Disease

## 2024-05-06 NOTE — Telephone Encounter (Signed)
 See MyChart message

## 2024-05-06 NOTE — Progress Notes (Signed)
 Subjective:    Patient ID: Erin Cantu is a 58 y.o. (DOB Oct 19, 1965) female.     Patient presents with  . Coronary Artery Disease     Erin Cantu presents for Cardiology follow up. Patient reports occasional chest discomfort relieved by rest. She has not taken sl NTG due to misunderstanding.about use. B/P controlled this visit. Post PCI LCX in 2019.  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  This is a very hot environment that worsens her symptoms.  04/2023- Coronary artery disease post PCI LCX in 07/2018,. No chest pain or sl NTG use. B/P readings borderline uncontrolled today. EKG today sinus bradycardia, rate 58 beats/min, No ischemic changes.  04/2022- No cardiac symptoms or events since last visit. No chest pain or sl NTG use. B/P controlled this visit. PCP visit yesterday with unremarkable lab work. EKG today sinus rhythm with no ischemic changes.   02/2021- Coronary artery disease post inferior myocardial infarction with PCI LCX/OM in 07/2018. Severe Covid-19 infection in 2021. Patient last seen by Cardiology with [facility] in 09/2020. Stable without change in medication or further testing. Patient denies cardiac symptoms or events since that visit. B/P readings borderline today. EKG today sinus rhythm with nonspecific T abnormality.  Echocardiogram 10/2019  1. Left ventricular ejection fraction, by visual estimation, is 60 to 65%. The left ventricle has normal function. There is no left ventricular hypertrophy.   2. Left ventricular diastolic parameters are consistent with Grade I diastolic dysfunction (impaired relaxation).   3. The left ventricle has no regional wall motion abnormalities.   4. Global right ventricle has normal systolic function.The right ventricular size is normal.   5. Left atrial size was normal.   6. Right atrial size was normal.   7. The mitral valve is normal in structure. Trivial mitral valve regurgitation. No evidence of mitral stenosis.   8. The tricuspid valve  is normal in structure. Tricuspid valve regurgitation is trivial.   9. The aortic valve is normal in structure. Aortic valve regurgitation is not visualized. No evidence of aortic valve sclerosis or stenosis.  10. The pulmonic valve was normal in structure. Pulmonic valve regurgitation is not visualized.  11. The inferior vena cava is normal in size with greater than 50% respiratory variability, suggesting right atrial pressure of 3 mmHg.  12. Normal LV systolic function; grade 1 diastolic dysfunction.     Coronary Artery Disease Presents for follow-up visit. Pertinent negatives include no chest pain, chest pressure, chest tightness, dizziness, leg swelling, muscle weakness, palpitations, shortness of breath or weight gain. The symptoms have been stable. Compliance with diet is variable. Compliance with exercise is variable. Compliance with medications is good.     Reviewed and updated this visit by provider: Tobacco  Allergies  Med Hx  OB Hx  SE Hx  Surg Hx  Fam Hx       Review of Systems  Constitutional:  Negative for weight gain.  Respiratory:  Negative for chest tightness and shortness of breath.   Cardiovascular:  Negative for chest pain, palpitations and leg swelling.  Musculoskeletal:  Negative for muscle weakness.  Neurological:  Negative for dizziness.   Lab Results  Component Value Date   WBC 4.9 08/09/2023   HGB 12.4 08/09/2023   HCT 36.5 08/09/2023   Plt Ct 222 08/09/2023   Cholesterol, Total 128 04/25/2023   Triglycerides 64 04/25/2023   HDL 57 04/25/2023   LDL 58 04/25/2023   ALT (SGPT) 17 08/09/2023   AST 16 08/09/2023  Sodium 146 (H) 08/09/2023   Potassium 3.6 08/09/2023   Chloride 107 (H) 08/09/2023   Creatinine 0.85 08/09/2023   BUN 8 08/09/2023   CO2 25 08/09/2023   TSH 1.990 05/31/2023   Glucose 81 02/23/2024   Hemoglobin A1c 5.9 (A) 02/23/2024     Objective:   Vitals:   05/06/24 1100  BP: 125/70  Pulse: 71  Resp: 18  Height: 5' 2  (1.575 m)  Weight: 243 lb 8 oz (110.5 kg)  SpO2: 98%  BMI (Calculated): 44.5  PainSc: 0-No pain   Physical Exam Constitutional:      General: She is not in acute distress. HENT:     Head: Atraumatic.     Nose: No congestion.     Mouth/Throat:     Pharynx: Oropharynx is clear.   Eyes:     Pupils: Pupils are equal, round, and reactive to light.    Cardiovascular:     Rate and Rhythm: Normal rate and regular rhythm.     Heart sounds: No murmur heard.    No gallop.   Musculoskeletal:        General: Normal range of motion.     Cervical back: Normal range of motion.  Pulmonary:     Effort: No respiratory distress.     Breath sounds: No wheezing or rales.  Abdominal:     General: There is no distension.     Tenderness: There is no abdominal tenderness. There is no guarding.   Skin:    General: Skin is warm.  Neurological:     General: No focal deficit present.     Mental Status: She is alert and oriented to person, place, and time.     Cranial Nerves: No cranial nerve deficit.     Motor: No weakness.     Gait: Gait normal.       Assessment / Plan:   Assessment 1. Coronary artery disease involving native coronary artery of native heart without angina pectoris (Primary) -     ECG 12 lead -     Basic Metabolic Panel; Future -     CBC And Differential; Future -     Lipid Panel; Future -     NM Heart Spect Ph Stress (C); Future 2. History of ST elevation myocardial infarction (STEMI) -     ECG 12 lead -     Basic Metabolic Panel; Future -     CBC And Differential; Future -     Lipid Panel; Future -     NM Heart Spect Ph Stress (C); Future 3. Hypertension, unspecified type -     ECG 12 lead -     Basic Metabolic Panel; Future -     CBC And Differential; Future -     Lipid Panel; Future -     NM Heart Spect Ph Stress (C); Future    Plan  No medication changes this visit Lab work today Lexiscan  Cardiolite SpECT study Return in 6-8 weeks    Patient's  Medications       * Accurate as of May 06, 2024 11:33 AM. Reflects encounter med changes as of last refresh          Continued Medications      Instructions  atorvastatin  80 mg tablet Commonly known as: LIPITOR   80 mg, Oral, At bedtime   clopidogrel  bisulfate 75 mg tablet Commonly known as: PLAVIX   75 mg, Oral, Daily   * DEXCOM G7 RECEIVER Devi  See admin  instructions   * DEXCOM G7 RECEIVER Devi  See admin instructions   * DEXCOM G7 SENSOR Misc  USE AS DIRECTED TO CHECK BLOOD SUGAR, CHANGE SENSOR EVERY 10 DAYS   * DEXCOM G7 SENSOR Misc  1 each, Does not apply, Every 10 days   fluconazole 150 mg tablet Commonly known as: DIFLUCAN  Take 1 tablet once for yeast infection; may repeat once in 3 days if needed   metoprolol  tartrate 25 mg tablet Commonly known as: LOPRESSOR   25 mg, Oral, 2 times a day   MOUNJARO  5 mg/0.5 mL Soaj pen injection Generic drug: tirzepatide   5 mg, Subcutaneous, Weekly   nitroGLYCERIN  0.4 mg SL tablet Commonly known as: NITROSTAT   0.4 mg, Oral, Every 5 minutes as needed   pantoprazole  sodium 20 mg tablet Commonly known as: PROTONIX   20 mg, Oral, Daily   valacyclovir  1000 mg tablet Commonly known as: VALTREX   1,000 mg, Oral, 2 times a day      * * This list has 4 medication(s) that are the same as other medications prescribed for you. Read the directions carefully, and ask your doctor or other care provider to review them with you.            Risks, benefits, and alternatives of the medications and treatment plan prescribed today were discussed, and patient expressed understanding. Plan follow-up as discussed or as needed if any worsening symptoms or change in condition.         *Some images could not be shown.

## 2024-05-06 NOTE — Telephone Encounter (Signed)
 Patient wants to get her job restrictions letter updated and sent to her through MyChart and mail.  See patient's MyChart message.

## 2024-05-29 ENCOUNTER — Other Ambulatory Visit (HOSPITAL_COMMUNITY): Payer: Self-pay

## 2024-05-29 MED ORDER — MOUNJARO 5 MG/0.5ML ~~LOC~~ SOAJ
5.0000 mg | SUBCUTANEOUS | 0 refills | Status: AC
  Filled 2024-05-29: qty 6, 84d supply, fill #0
  Filled 2024-06-04: qty 2, 28d supply, fill #0

## 2024-06-04 ENCOUNTER — Other Ambulatory Visit: Payer: Self-pay

## 2024-06-21 ENCOUNTER — Other Ambulatory Visit: Payer: Self-pay

## 2024-06-21 ENCOUNTER — Other Ambulatory Visit (HOSPITAL_COMMUNITY): Payer: Self-pay

## 2024-06-21 MED ORDER — ACYCLOVIR 5 % EX OINT
TOPICAL_OINTMENT | CUTANEOUS | 3 refills | Status: AC
  Filled 2024-06-21: qty 15, 30d supply, fill #0

## 2024-06-21 MED ORDER — ACYCLOVIR 5 % EX CREA
1.0000 | TOPICAL_CREAM | CUTANEOUS | 4 refills | Status: DC
  Filled 2024-06-21: qty 5, 2d supply, fill #0
  Filled 2024-06-21: qty 15, 2d supply, fill #0

## 2024-06-21 MED ORDER — VALACYCLOVIR HCL 1 G PO TABS
1000.0000 mg | ORAL_TABLET | Freq: Two times a day (BID) | ORAL | 0 refills | Status: DC
  Filled 2024-06-21 (×2): qty 14, 7d supply, fill #0

## 2024-06-24 ENCOUNTER — Other Ambulatory Visit: Payer: Self-pay

## 2024-07-23 ENCOUNTER — Other Ambulatory Visit: Payer: Self-pay

## 2024-07-23 ENCOUNTER — Emergency Department (HOSPITAL_COMMUNITY)

## 2024-07-23 ENCOUNTER — Emergency Department (HOSPITAL_COMMUNITY)
Admission: EM | Admit: 2024-07-23 | Discharge: 2024-07-23 | Disposition: A | Discharge status: Home / Self Care | Attending: Emergency Medicine | Admitting: Emergency Medicine

## 2024-07-23 ENCOUNTER — Encounter (HOSPITAL_COMMUNITY): Payer: Self-pay | Admitting: Emergency Medicine

## 2024-07-23 DIAGNOSIS — Y92410 Unspecified street and highway as the place of occurrence of the external cause: Secondary | ICD-10-CM | POA: Diagnosis not present

## 2024-07-23 DIAGNOSIS — S161XXA Strain of muscle, fascia and tendon at neck level, initial encounter: Secondary | ICD-10-CM | POA: Diagnosis not present

## 2024-07-23 DIAGNOSIS — M545 Low back pain, unspecified: Secondary | ICD-10-CM | POA: Diagnosis not present

## 2024-07-23 DIAGNOSIS — R0789 Other chest pain: Secondary | ICD-10-CM | POA: Insufficient documentation

## 2024-07-23 DIAGNOSIS — M542 Cervicalgia: Secondary | ICD-10-CM | POA: Diagnosis present

## 2024-07-23 DIAGNOSIS — M7918 Myalgia, other site: Secondary | ICD-10-CM

## 2024-07-23 DIAGNOSIS — M25512 Pain in left shoulder: Secondary | ICD-10-CM | POA: Insufficient documentation

## 2024-07-23 MED ORDER — ACETAMINOPHEN 325 MG PO TABS
650.0000 mg | ORAL_TABLET | Freq: Once | ORAL | Status: AC
  Administered 2024-07-23: 650 mg via ORAL
  Filled 2024-07-23: qty 2

## 2024-07-23 MED ORDER — METHOCARBAMOL 500 MG PO TABS: 500.0000 mg | ORAL_TABLET | Freq: Three times a day (TID) | ORAL | 0 refills | Status: AC | PRN

## 2024-07-23 MED ORDER — HYDROCODONE-ACETAMINOPHEN 5-325 MG PO TABS: 1.0000 | ORAL_TABLET | Freq: Four times a day (QID) | ORAL | 0 refills | Status: AC | PRN
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
## 2024-07-24 ENCOUNTER — Other Ambulatory Visit (HOSPITAL_COMMUNITY): Payer: Self-pay

## 2024-07-24 ENCOUNTER — Other Ambulatory Visit: Payer: Self-pay

## 2024-07-24 MED ORDER — VALACYCLOVIR HCL 1 G PO TABS
1000.0000 mg | ORAL_TABLET | Freq: Two times a day (BID) | ORAL | 0 refills | Status: AC
  Filled 2024-07-24 (×2): qty 14, 7d supply, fill #0

## 2024-07-26 ENCOUNTER — Other Ambulatory Visit: Payer: Self-pay

## 2024-08-01 ENCOUNTER — Other Ambulatory Visit: Payer: Self-pay

## 2024-08-01 MED ORDER — CYCLOBENZAPRINE HCL 10 MG PO TABS
10.0000 mg | ORAL_TABLET | Freq: Three times a day (TID) | ORAL | 0 refills | Status: AC | PRN
Start: 2024-08-01 — End: ?
  Filled 2024-08-01: qty 30, 10d supply, fill #0

## 2024-08-01 MED ORDER — PANTOPRAZOLE SODIUM 40 MG PO TBEC
40.0000 mg | DELAYED_RELEASE_TABLET | Freq: Every day | ORAL | 1 refills | Status: AC
  Filled 2024-08-01: qty 30, 30d supply, fill #0

## 2024-08-05 ENCOUNTER — Other Ambulatory Visit: Payer: Self-pay

## 2024-08-08 ENCOUNTER — Other Ambulatory Visit: Payer: Self-pay

## 2024-08-09 ENCOUNTER — Other Ambulatory Visit: Payer: Self-pay

## 2024-08-22 ENCOUNTER — Other Ambulatory Visit: Payer: Self-pay

## 2024-08-22 MED ORDER — METOPROLOL TARTRATE 25 MG PO TABS
25.0000 mg | ORAL_TABLET | Freq: Two times a day (BID) | ORAL | 3 refills | Status: AC
  Filled 2024-08-22: qty 180, 90d supply, fill #0
  Filled 2024-11-13: qty 100, 50d supply, fill #1
  Filled 2024-11-15: qty 180, 90d supply, fill #1

## 2024-08-22 MED ORDER — CLOPIDOGREL BISULFATE 75 MG PO TABS
75.0000 mg | ORAL_TABLET | Freq: Every day | ORAL | 3 refills | Status: AC
  Filled 2024-08-22: qty 90, 90d supply, fill #0
  Filled 2024-11-13 – 2024-11-15 (×3): qty 90, 90d supply, fill #1

## 2024-08-22 MED ORDER — ATORVASTATIN CALCIUM 80 MG PO TABS
80.0000 mg | ORAL_TABLET | Freq: Every day | ORAL | 3 refills | Status: AC
  Filled 2024-08-22: qty 90, 90d supply, fill #0
  Filled 2024-11-13 – 2024-11-15 (×2): qty 90, 90d supply, fill #1

## 2024-08-27 ENCOUNTER — Other Ambulatory Visit: Payer: Self-pay

## 2024-08-27 MED ORDER — TIZANIDINE HCL 4 MG PO TABS
2.0000 mg | ORAL_TABLET | Freq: Every day | ORAL | 3 refills | Status: AC
  Filled 2024-08-27: qty 30, 30d supply, fill #0

## 2024-08-27 MED ORDER — PREDNISONE 20 MG PO TABS
20.0000 mg | ORAL_TABLET | Freq: Two times a day (BID) | ORAL | 0 refills | Status: AC
  Filled 2024-08-27: qty 10, 5d supply, fill #0

## 2024-08-29 ENCOUNTER — Other Ambulatory Visit: Payer: Self-pay

## 2024-08-30 ENCOUNTER — Other Ambulatory Visit: Payer: Self-pay

## 2024-10-11 ENCOUNTER — Emergency Department (HOSPITAL_COMMUNITY)
Admission: EM | Admit: 2024-10-11 | Discharge: 2024-10-11 | Disposition: A | Discharge status: Home / Self Care | Payer: Self-pay | Attending: Emergency Medicine | Admitting: Emergency Medicine

## 2024-10-11 ENCOUNTER — Other Ambulatory Visit: Payer: Self-pay

## 2024-10-11 DIAGNOSIS — H60501 Unspecified acute noninfective otitis externa, right ear: Secondary | ICD-10-CM

## 2024-10-11 DIAGNOSIS — H60511 Acute actinic otitis externa, right ear: Secondary | ICD-10-CM | POA: Insufficient documentation

## 2024-10-11 DIAGNOSIS — Z7902 Long term (current) use of antithrombotics/antiplatelets: Secondary | ICD-10-CM | POA: Insufficient documentation

## 2024-10-11 DIAGNOSIS — H6691 Otitis media, unspecified, right ear: Secondary | ICD-10-CM | POA: Insufficient documentation

## 2024-10-11 DIAGNOSIS — Z79899 Other long term (current) drug therapy: Secondary | ICD-10-CM | POA: Insufficient documentation

## 2024-10-11 DIAGNOSIS — J069 Acute upper respiratory infection, unspecified: Secondary | ICD-10-CM | POA: Insufficient documentation

## 2024-10-11 MED ORDER — AMOXICILLIN-POT CLAVULANATE 875-125 MG PO TABS: 1.0000 | ORAL_TABLET | Freq: Two times a day (BID) | ORAL | 0 refills | Status: AC

## 2024-10-11 MED ORDER — OFLOXACIN 0.3 % OT SOLN: 10.0000 [drp] | Freq: Every day | OTIC | 0 refills | Status: AC

## 2024-10-11 NOTE — ED Triage Notes (Signed)
 Pt c/o headache, cough, congestion, and bilateral ear pain x lastnight

## 2024-10-11 NOTE — ED Provider Notes (Signed)
 " [place] EMERGENC[facility] DEPARTMENT AT [facility] Provider Note   CSN: [id number] Arrival date & time: 10/11/24  9351     Patient presents with: Nasal Congestion   Erin Cantu is a 59 y.o. female. presents today with bilateral ear pain and headache that started yesterday. She notes URI symptoms for the past week which are resolving. Pt states the right ear is worse than left ear. Pt rates the pain a 8 out of 10.  She took Tylenol  without relief.  Denies recent water history, denies ear discharge. Denies sick contacts. Patient denies any difficulties, fever, cough, sore throat, shortness of breath, chest pain, weakness, dizziness, tinnitus, or any other symptoms at this time.       HPI     Prior to Admission medications  Medication Sig Start Date End Date Taking? Authorizing Provider  amoxicillin -clavulanate (AUGMENTIN ) 875-125 MG tablet Take 1 tablet by mouth every 12 (twelve) hours. 10/11/24  Yes Braxton, Erland Vivas, PA-C  ofloxacin  (FLOXIN ) 0.3 % OTIC solution Place 10 drops into the right ear daily for 7 days. 10/11/24 10/18/24 Yes Dana Dorner, PA-C  acetaminophen  (TYLENOL ) 500 MG tablet Take 1 tablet (500 mg total) by mouth every 6 (six) hours as needed. 07/19/19   Stroud, Natalie M, FNP  acyclovir  ointment (ZOVIRAX ) 5 % Apply thin layer to affected area 06/21/24     atorvastatin  (LIPITOR ) 80 MG tablet Take 1 tablet (80 mg total) by mouth daily at 6 PM. 01/20/20   Stroud, Natalie M, FNP  atorvastatin  (LIPITOR ) 80 MG tablet TAKE 1 TABLET BY MOUTH NIGHTLY AT BEDTIME 01/01/21 01/01/22  Hollandsworth, Lynwood PARAS, MD  atorvastatin  (LIPITOR ) 80 MG tablet TAKE 1 TABLET BY MOUTH NIGHTLY AT BEDTIME 01/06/22     atorvastatin  (LIPITOR ) 80 MG tablet Take 1 tablet (80 mg total) by mouth at bedtime. 03/30/22     atorvastatin  (LIPITOR ) 80 MG tablet Take 1 tablet (80 mg total) by mouth at bedtime. 06/28/22     atorvastatin  (LIPITOR ) 80 MG tablet Take 1 tablet (80 mg total) by mouth at bedtime. 04/14/23      atorvastatin  (LIPITOR ) 80 MG tablet Take 1 tablet (80 mg total) by mouth at bedtime. 08/22/24     clopidogrel  (PLAVIX ) 75 MG tablet TAKE 1 TABLET (75 MG TOTAL) BY MOUTH DAILY. 10/29/20   Stroud, Natalie M, FNP  clopidogrel  (PLAVIX ) 75 MG tablet Take one tablet (75 mg dose) by mouth daily. 01/06/22     clopidogrel  (PLAVIX ) 75 MG tablet Take one tablet (75 mg dose) by mouth daily. 03/30/22     clopidogrel  (PLAVIX ) 75 MG tablet Take 1 tablet (75 mg total) by mouth daily. 06/28/22     clopidogrel  (PLAVIX ) 75 MG tablet Take 1 tablet (75 mg total) by mouth daily. 04/14/23     clopidogrel  (PLAVIX ) 75 MG tablet Take 1 tablet (75 mg total) by mouth daily. 08/22/24     cyclobenzaprine  (FLEXERIL ) 10 MG tablet Take one tablet (10 mg dose) by mouth 3 (three) times a day as needed for up to 10 days. 08/01/24     HYDROcodone -acetaminophen  (NORCO/VICODIN) 5-325 MG tablet Take 1 tablet by mouth every 6 (six) hours as needed for severe pain (pain score 7-10). 07/23/24   Idol, Julie, PA-C  methocarbamol  (ROBAXIN ) 500 MG tablet Take 1 tablet (500 mg total) by mouth every 8 (eight) hours as needed for muscle spasms. 07/23/24   Idol, Julie, PA-C  metoprolol  tartrate (LOPRESSOR ) 25 MG tablet Take 1 tablet (25 mg total) by mouth 2 (  two) times daily. 01/20/20   Stroud, Natalie M, FNP  metoprolol  tartrate (LOPRESSOR ) 25 MG tablet TAKE 1 TABLET (25 MG TOTAL) BY MOUTH 2 (TWO) TIMES DAILY. 01/20/20 01/19/21  Stroud, Natalie M, FNP  metoprolol  tartrate (LOPRESSOR ) 25 MG tablet Take 1 tablet (25 mg total) by mouth 2 (two) times daily. 06/28/22     metoprolol  tartrate (LOPRESSOR ) 25 MG tablet Take 1 tablet (25 mg total) by mouth 2 (two) times daily. 04/14/23     metoprolol  tartrate (LOPRESSOR ) 25 MG tablet Take 1 tablet (25 mg total) by mouth 2 (two) times daily. 08/22/24     nitroGLYCERIN  (NITROSTAT ) 0.4 MG SL tablet PLACE 1 TABLET UNDER THE TONGUE EVERY FIVE MINUTES X 3 DOSES AS NEEDED FOR CHEST PAIN. 11/11/20   Stroud, Natalie M, FNP   nitroGLYCERIN  (NITROSTAT ) 0.4 MG SL tablet PLACE 1 TABLET UNDER THE TONGUE EVERY FIVE MINUTES X 3 DOSES AS NEEDED FOR CHEST PAIN. 11/10/20 11/10/21  Stroud, Natalie M, FNP  nitroGLYCERIN  (NITROSTAT ) 0.4 MG SL tablet Take one tablet (0.4 mg dose) by mouth every 5 (five) minutes as needed for Chest pain. 04/14/23     nitroGLYCERIN  (NITROSTAT ) 0.4 MG SL tablet Place 1 tablet (0.4 mg total) under the tongue every 5 (five) minutes as needed for Chest pain.. 02/23/24     pantoprazole  (PROTONIX ) 20 MG tablet Take 1 tablet (20 mg total) by mouth daily. 08/30/22     pantoprazole  (PROTONIX ) 40 MG tablet Take 1 tablet (40 mg total) by mouth daily. 10/13/20 01/11/21  Stroud, Natalie M, FNP  pantoprazole  (PROTONIX ) 40 MG tablet TAKE 1 TABLET (40 MG TOTAL) BY MOUTH DAILY. 10/13/20 10/13/21  Stroud, Natalie M, FNP  pantoprazole  (PROTONIX ) 40 MG tablet Take 1 tablet (40 mg total) by mouth daily. 08/01/24     predniSONE  (DELTASONE ) 20 MG tablet Take 1 tablet (20 mg total) by mouth 2 (two) times daily with breakfast and lunch. 08/27/24     tirzepatide  (MOUNJARO ) 5 MG/0.5ML Pen Inject 5 mg into the skin once a week. 05/29/24     tiZANidine  (ZANAFLEX ) 4 MG tablet Take 0.5 tablets (2 mg total) by mouth at bedtime. May increase to 1 tab if needed 08/27/24     triamcinolone  cream (KENALOG ) 0.1 % Apply topically 2 (two) times daily. 04/26/22     triamcinolone  cream (KENALOG ) 0.1 % Apply topically 2 (two) times daily. 04/26/22     valACYclovir  (VALTREX ) 1000 MG tablet Take 1 tablet (1,000 mg total) by mouth 2 (two) times daily for 7 days. 07/24/24     valACYclovir  (VALTREX ) 500 MG tablet TAKE 1 TABLET BY MOUTH DAILY. 09/17/20   Stroud, Natalie M, FNP  valACYclovir  (VALTREX ) 500 MG tablet Take one tablet (500 mg dose) by mouth daily. 09/23/21       Allergies: Patient has no known allergies.    Review of Systems  HENT:  Positive for ear pain.     Updated Vital Signs BP (!) 109/57   Pulse 76   Temp 100.1 F (37.8 C) (Oral)   Resp 15    SpO2 98%   Physical Exam Vitals and nursing note reviewed.  Constitutional:      General: She is not in acute distress.    Appearance: Normal appearance. She is well-developed.  HENT:     Head: Normocephalic and atraumatic.     Jaw: No tenderness, swelling or pain on movement.     Right Ear: No decreased hearing noted. Swelling and tenderness present. No drainage. No middle ear  effusion. There is no impacted cerumen. No foreign body. No mastoid tenderness. No PE tube. Tympanic membrane is injected, erythematous and bulging. Tympanic membrane is not perforated or retracted. Tympanic membrane has normal mobility.     Left Ear: No decreased hearing noted. No laceration, drainage, swelling or tenderness. No PE tube. Tympanic membrane is not perforated, erythematous, retracted or bulging. Tympanic membrane has normal mobility.     Ears:     Comments: Tenderness of right tragus, pinna with swelling of right external canal, erythematous and bulging right TM.  Tenderness of right ear with insertion of otoscope.  No mastoid tenderness or swelling    Nose: No nasal deformity or septal deviation.     Right Turbinates: Swollen. Not enlarged or pale.     Left Turbinates: Swollen. Not enlarged or pale.     Comments: Both turbinates swollen, distended with recent URI    Mouth/Throat:     Lips: Pink.     Mouth: Mucous membranes are moist.     Pharynx: Oropharynx is clear.     Tonsils: No tonsillar exudate or tonsillar abscesses.  Eyes:     Conjunctiva/sclera: Conjunctivae normal.  Cardiovascular:     Rate and Rhythm: Normal rate and regular rhythm.     Heart sounds: No murmur heard. Pulmonary:     Effort: Pulmonary effort is normal. No respiratory distress.     Breath sounds: Normal breath sounds.  Abdominal:     Palpations: Abdomen is soft.     Tenderness: There is no abdominal tenderness.  Musculoskeletal:        General: No swelling.     Cervical back: Neck supple.  Skin:    General:  Skin is warm and dry.     Capillary Refill: Capillary refill takes less than 2 seconds.  Neurological:     Mental Status: She is alert.  Psychiatric:        Mood and Affect: Mood normal.     (all labs ordered are listed, but only abnormal results are displayed) Labs Reviewed - No data to display  EKG: None  Radiology: No results found.   Procedures   Medications Ordered in the ED - No data to display                                  Medical Decision Making    This patient presents to the ED for concern of right ear pain and headache. differential diagnosis includes otitis media, otitis externa, URI, influenza    Additional history obtained   Additional history obtained from Electronic Medical Record   Dispostion: Patient presents with bilateral ear pain, worse on right.  On exam she is alert and not in distress. There is tenderness to the right tragus and pinna as well as swelling to the right external canal on exam.  Right TM was erythematous and bulging.  No mastoid tenderness or swelling appreciated.  Both turbinates were swollen on exam.  This is consistent with recent URI.  Patient is afebrile with stable VS. I informed patient that this was consistent with otitis externa and otitis media. Will prescribe ofloxacin  drops and Augmentin .  Recommended patient to follow-up with PCP for further management.  Suggested patient use Flonase and Coricidin for her symptoms.  Patient is in agreement with plan and is stable for discharge.        Final diagnoses:  Right otitis media, unspecified otitis media  type  Acute otitis externa of right ear, unspecified type  Viral upper respiratory tract infection    ED Discharge Orders          Ordered    ofloxacin  (FLOXIN ) 0.3 % OTIC solution  Daily        10/11/24 0739    amoxicillin -clavulanate (AUGMENTIN ) 875-125 MG tablet  Every 12 hours        10/11/24 0739               Braxton Dubois, PA-C 10/11/24 FRANCENE     Cleotilde Rogue, MD 10/12/24 [phone]  "

## 2024-10-11 NOTE — Discharge Instructions (Addendum)
 Apply ear drops into right ear and take antibiotics for ear infection. Follow-up with PCP for further evaluation.   Can take Coricdin cold relief over the counter for symptoms of cold. Take Flonase as needed.

## 2024-11-13 ENCOUNTER — Other Ambulatory Visit: Payer: Self-pay

## 2024-11-15 ENCOUNTER — Other Ambulatory Visit (HOSPITAL_COMMUNITY): Payer: Self-pay

## 2024-11-15 ENCOUNTER — Other Ambulatory Visit: Payer: Self-pay
